# Patient Record
Sex: Female | Born: 1960 | Race: White | Hispanic: No | Marital: Married | Smoking: Never smoker
Health system: Southern US, Community
[De-identification: ages and names within clinical notes are randomized; demographics above are authoritative.]

## PROBLEM LIST (undated history)

## (undated) DIAGNOSIS — C4491 Basal cell carcinoma of skin, unspecified: Secondary | ICD-10-CM

## (undated) DIAGNOSIS — M810 Age-related osteoporosis without current pathological fracture: Secondary | ICD-10-CM

## (undated) HISTORY — PX: MOHS SURGERY: SUR867

## (undated) HISTORY — DX: Age-related osteoporosis without current pathological fracture: M81.0

## (undated) HISTORY — DX: Basal cell carcinoma of skin, unspecified: C44.91

---

## 1997-03-04 ENCOUNTER — Ambulatory Visit (HOSPITAL_COMMUNITY): Admission: RE | Admit: 1997-03-04 | Discharge: 1997-03-04 | Payer: Self-pay | Admitting: Gynecology

## 1999-11-10 ENCOUNTER — Other Ambulatory Visit: Admission: RE | Admit: 1999-11-10 | Discharge: 1999-11-10 | Payer: Self-pay | Admitting: Gynecology

## 1999-11-17 ENCOUNTER — Encounter: Payer: Self-pay | Admitting: Gynecology

## 1999-11-17 ENCOUNTER — Encounter: Admission: RE | Admit: 1999-11-17 | Discharge: 1999-11-17 | Payer: Self-pay | Admitting: Gynecology

## 2000-11-16 ENCOUNTER — Other Ambulatory Visit: Admission: RE | Admit: 2000-11-16 | Discharge: 2000-11-16 | Payer: Self-pay | Admitting: Gynecology

## 2000-11-22 ENCOUNTER — Encounter: Admission: RE | Admit: 2000-11-22 | Discharge: 2000-11-22 | Payer: Self-pay | Admitting: Gynecology

## 2000-11-22 ENCOUNTER — Encounter: Payer: Self-pay | Admitting: Gynecology

## 2001-11-16 ENCOUNTER — Other Ambulatory Visit: Admission: RE | Admit: 2001-11-16 | Discharge: 2001-11-16 | Payer: Self-pay | Admitting: Gynecology

## 2003-01-15 ENCOUNTER — Encounter: Admission: RE | Admit: 2003-01-15 | Discharge: 2003-01-15 | Payer: Self-pay | Admitting: Gynecology

## 2003-11-20 ENCOUNTER — Other Ambulatory Visit: Admission: RE | Admit: 2003-11-20 | Discharge: 2003-11-20 | Payer: Self-pay | Admitting: Gynecology

## 2004-02-06 ENCOUNTER — Encounter: Admission: RE | Admit: 2004-02-06 | Discharge: 2004-02-06 | Payer: Self-pay | Admitting: Gynecology

## 2004-11-23 ENCOUNTER — Other Ambulatory Visit: Admission: RE | Admit: 2004-11-23 | Discharge: 2004-11-23 | Payer: Self-pay | Admitting: Gynecology

## 2005-03-08 ENCOUNTER — Encounter: Admission: RE | Admit: 2005-03-08 | Discharge: 2005-03-08 | Payer: Self-pay | Admitting: Gynecology

## 2006-02-16 ENCOUNTER — Other Ambulatory Visit: Admission: RE | Admit: 2006-02-16 | Discharge: 2006-02-16 | Payer: Self-pay | Admitting: Gynecology

## 2006-03-10 ENCOUNTER — Encounter: Admission: RE | Admit: 2006-03-10 | Discharge: 2006-03-10 | Payer: Self-pay | Admitting: Gynecology

## 2007-04-19 ENCOUNTER — Other Ambulatory Visit: Admission: RE | Admit: 2007-04-19 | Discharge: 2007-04-19 | Payer: Self-pay | Admitting: Gynecology

## 2007-09-06 ENCOUNTER — Encounter: Admission: RE | Admit: 2007-09-06 | Discharge: 2007-09-06 | Payer: Self-pay | Admitting: Gynecology

## 2007-10-19 ENCOUNTER — Ambulatory Visit: Payer: Self-pay | Admitting: Gynecology

## 2008-05-22 ENCOUNTER — Other Ambulatory Visit: Admission: RE | Admit: 2008-05-22 | Discharge: 2008-05-22 | Payer: Self-pay | Admitting: Gynecology

## 2008-05-22 ENCOUNTER — Ambulatory Visit: Payer: Self-pay | Admitting: Gynecology

## 2008-05-22 ENCOUNTER — Encounter: Payer: Self-pay | Admitting: Gynecology

## 2008-09-17 ENCOUNTER — Encounter: Admission: RE | Admit: 2008-09-17 | Discharge: 2008-09-17 | Payer: Self-pay | Admitting: Gynecology

## 2009-08-21 ENCOUNTER — Ambulatory Visit: Payer: Self-pay | Admitting: Gynecology

## 2009-08-21 ENCOUNTER — Other Ambulatory Visit: Admission: RE | Admit: 2009-08-21 | Discharge: 2009-08-21 | Payer: Self-pay | Admitting: Gynecology

## 2009-09-22 ENCOUNTER — Encounter: Admission: RE | Admit: 2009-09-22 | Discharge: 2009-09-22 | Payer: Self-pay | Admitting: Gynecology

## 2010-08-18 ENCOUNTER — Other Ambulatory Visit: Payer: Self-pay | Admitting: Gynecology

## 2010-08-18 DIAGNOSIS — Z1231 Encounter for screening mammogram for malignant neoplasm of breast: Secondary | ICD-10-CM

## 2010-08-26 ENCOUNTER — Other Ambulatory Visit (HOSPITAL_COMMUNITY)
Admission: RE | Admit: 2010-08-26 | Discharge: 2010-08-26 | Disposition: A | Discharge status: Home / Self Care | Payer: Managed Care, Other (non HMO) | Source: Ambulatory Visit | Attending: Gynecology | Admitting: Gynecology

## 2010-08-26 ENCOUNTER — Ambulatory Visit (INDEPENDENT_AMBULATORY_CARE_PROVIDER_SITE_OTHER): Payer: Managed Care, Other (non HMO) | Admitting: Gynecology

## 2010-08-26 ENCOUNTER — Encounter: Payer: Self-pay | Admitting: Gynecology

## 2010-08-26 VITALS — BP 120/80 | Ht 65.25 in | Wt 152.0 lb

## 2010-08-26 DIAGNOSIS — Z01419 Encounter for gynecological examination (general) (routine) without abnormal findings: Secondary | ICD-10-CM

## 2010-08-26 DIAGNOSIS — Z30431 Encounter for routine checking of intrauterine contraceptive device: Secondary | ICD-10-CM

## 2010-08-26 DIAGNOSIS — Z131 Encounter for screening for diabetes mellitus: Secondary | ICD-10-CM

## 2010-08-26 DIAGNOSIS — Z13220 Encounter for screening for lipoid disorders: Secondary | ICD-10-CM

## 2010-08-26 NOTE — Progress Notes (Signed)
Kimberly Schwartz 1960-09-24 [id number]   History:    50 y.o.  for annual exam doing well Mirena IUD no menses. She's not having any menopausal symptoms.  Past medical history,surgical history, family history and social history were all reviewed and documented in the EPIC chart. ROS:  Was performed and pertinent positives and negatives are included in the history.  Exam: chaperone present Filed Vitals:   08/26/10 0948  BP: 120/80   General appearance  Normal Skin grossly normal Head/Neck normal with no cervical or supraclavicular adenopathy thyroid normal Lungs  clear Cardiac RR, without RMG Abdominal  soft, nontender, without masses, guarding, rebound or organomegaly  Breasts  examined lying and sitting without masses, retractions, discharge or axillary adenopathy. Pelvic  Ext/BUS/vagina  normal   Cervix  Normal, IUD string visualized Pap was done   Uterus  anteverted, normal size, shape and contour, midline and mobile nontender   Adnexa  Without masses or tenderness  Anus and perineum  normal   Rectovaginal  normal sphincter tone without palpated masses or tenderness.   Assessment/Plan:  50 y.o. female for annual exam. #1 Health maintenance self breast exams on a monthly basis discussed and urged has mammas scheduled this coming month will followup for this and continue with annual mammography. I recommended screening colonoscopy this coming year she's ever had one she agrees to arrange this increase calcium vitamin D.  Baseline CBC glucose lipid profile and urinalysis were ordered #2 IUD she is amenorrheic with her IUD will plan to keep for the full 5 years she is not having any menopausal symptoms Will plan on checking [facility] next year to see where we stand.     Dara Lords MD, 10:30 AM 08/26/2010

## 2010-08-27 ENCOUNTER — Telehealth: Payer: Self-pay | Admitting: Gynecology

## 2010-08-27 DIAGNOSIS — E789 Disorder of lipoprotein metabolism, unspecified: Secondary | ICD-10-CM

## 2010-08-27 NOTE — Telephone Encounter (Signed)
The patient cholesterol 235 mildly elevated LDL 156 which also is elevated. I recommend she get a fasting lipid profile. Her glucose urinalysis and CBC were okay.  I put an order in for the fasting lipid profile

## 2010-08-27 NOTE — Telephone Encounter (Signed)
PT INFORMED OF LAB RESULTS, SHE WILL CALL BACK TO MAKE APPOINTMENT TO GET FLP RECHECK. PT HAS TO CHECK HER  WORK SCHEDULE.

## 2010-09-29 ENCOUNTER — Ambulatory Visit
Admission: RE | Admit: 2010-09-29 | Discharge: 2010-09-29 | Disposition: A | Discharge status: Home / Self Care | Payer: Managed Care, Other (non HMO) | Source: Ambulatory Visit | Attending: Gynecology | Admitting: Gynecology

## 2010-09-29 DIAGNOSIS — Z1231 Encounter for screening mammogram for malignant neoplasm of breast: Secondary | ICD-10-CM

## 2011-10-06 ENCOUNTER — Other Ambulatory Visit: Payer: Self-pay | Admitting: Gynecology

## 2011-10-06 DIAGNOSIS — Z1231 Encounter for screening mammogram for malignant neoplasm of breast: Secondary | ICD-10-CM

## 2011-10-08 ENCOUNTER — Ambulatory Visit (INDEPENDENT_AMBULATORY_CARE_PROVIDER_SITE_OTHER): Payer: Managed Care, Other (non HMO) | Admitting: Gynecology

## 2011-10-08 ENCOUNTER — Ambulatory Visit
Admission: RE | Admit: 2011-10-08 | Discharge: 2011-10-08 | Disposition: A | Discharge status: Home / Self Care | Payer: Managed Care, Other (non HMO) | Source: Ambulatory Visit | Attending: Gynecology | Admitting: Gynecology

## 2011-10-08 ENCOUNTER — Encounter: Payer: Self-pay | Admitting: Gynecology

## 2011-10-08 VITALS — BP 104/60 | Ht 65.0 in | Wt 156.0 lb

## 2011-10-08 DIAGNOSIS — Z30431 Encounter for routine checking of intrauterine contraceptive device: Secondary | ICD-10-CM

## 2011-10-08 DIAGNOSIS — Z01419 Encounter for gynecological examination (general) (routine) without abnormal findings: Secondary | ICD-10-CM

## 2011-10-08 DIAGNOSIS — Z1231 Encounter for screening mammogram for malignant neoplasm of breast: Secondary | ICD-10-CM

## 2011-10-08 LAB — FOLLICLE STIMULATING HORMONE: FSH: 5.7 m[IU]/mL

## 2011-10-08 NOTE — Patient Instructions (Signed)
Follow up in one year for annual gynecologic exam. Office will call you with hormone test result.

## 2011-10-08 NOTE — Progress Notes (Signed)
Kimberly Schwartz 04/23/1960 [id number]        51 y.o.  G1P1 for annual exam.  Doing well.  Past medical history,surgical history, medications, allergies, family history and social history were all reviewed and documented in the EPIC chart. ROS:  Was performed and pertinent positives and negatives are included in the history.  Exam: [name] assistant Filed Vitals:   10/08/11 1501  BP: 104/60  Height: 5\' 5"  (1.651 m)  Weight: 156 lb (70.761 kg)   General appearance  Normal Skin grossly normal Head/Neck normal with no cervical or supraclavicular adenopathy thyroid normal Lungs  clear Cardiac RR, without RMG Abdominal  soft, nontender, without masses, organomegaly or hernia Breasts  examined lying and sitting without masses, retractions, discharge or axillary adenopathy. Pelvic  Ext/BUS/vagina  normal   Cervix  normal IUD string visualized  Uterus anteverted, normal size, shape and contour, midline and mobile nontender   Adnexa  Without masses or tenderness    Anus and perineum  normal   Rectovaginal  normal sphincter tone without palpated masses or tenderness.    Assessment/Plan:  51 y.o. G1P1 female for annual exam.   1. IUD management. Mirena IUD placed 08/2007. Has occasional spotting. Will check FSH now. Not having overt menopausal symptoms. IUD due to be removed next year. 2. Mammogram. Patient had mammogram today. We'll continue with annual mammography. SBE monthly reviewed. 3. Pap smear. No Pap smear done today. Last Pap smear 2012. No history of abnormal Pap smears before. We'll plan less frequent screening interval. 4. Colonoscopy. Patient will schedule this year. 5. Health maintenance. No blood work done today other than FSH. She plans to have routine blood work done at her primary physician's office and [place] who she sees annually. Follow up one year, sooner as needed    Kimberly Lords MD, 3:31 PM 10/08/2011

## 2011-10-09 LAB — URINALYSIS W MICROSCOPIC + REFLEX CULTURE
Bacteria, UA: NONE SEEN
Bilirubin Urine: NEGATIVE
Casts: NONE SEEN
Crystals: NONE SEEN
Glucose, UA: NEGATIVE mg/dL
Hgb urine dipstick: NEGATIVE
Ketones, ur: NEGATIVE mg/dL
Leukocytes, UA: NEGATIVE
Nitrite: NEGATIVE
Protein, ur: NEGATIVE mg/dL
Specific Gravity, Urine: 1.006 (ref 1.005–1.030)
Squamous Epithelial / LPF: NONE SEEN
Urobilinogen, UA: 0.2 mg/dL (ref 0.0–1.0)
pH: 5.5 (ref 5.0–8.0)

## 2012-10-11 ENCOUNTER — Encounter: Payer: Self-pay | Admitting: Gynecology

## 2012-10-23 LAB — HM COLONOSCOPY

## 2012-11-08 ENCOUNTER — Ambulatory Visit (INDEPENDENT_AMBULATORY_CARE_PROVIDER_SITE_OTHER): Payer: Managed Care, Other (non HMO) | Admitting: Gynecology

## 2012-11-08 ENCOUNTER — Encounter: Payer: Self-pay | Admitting: Gynecology

## 2012-11-08 ENCOUNTER — Telehealth: Payer: Self-pay | Admitting: *Deleted

## 2012-11-08 ENCOUNTER — Other Ambulatory Visit: Payer: Self-pay

## 2012-11-08 ENCOUNTER — Ambulatory Visit
Admission: RE | Admit: 2012-11-08 | Discharge: 2012-11-08 | Disposition: A | Discharge status: Home / Self Care | Payer: Managed Care, Other (non HMO) | Source: Ambulatory Visit

## 2012-11-08 VITALS — BP 120/76 | Ht 65.0 in | Wt 149.0 lb

## 2012-11-08 DIAGNOSIS — Z1231 Encounter for screening mammogram for malignant neoplasm of breast: Secondary | ICD-10-CM

## 2012-11-08 DIAGNOSIS — Z30431 Encounter for routine checking of intrauterine contraceptive device: Secondary | ICD-10-CM

## 2012-11-08 DIAGNOSIS — Z01419 Encounter for gynecological examination (general) (routine) without abnormal findings: Secondary | ICD-10-CM

## 2012-11-08 NOTE — Telephone Encounter (Signed)
Referral faxed to [facility], they will contact pt with time and date.

## 2012-11-08 NOTE — Progress Notes (Signed)
LESIELI BRESEE April 09, 1960 [id number]        52 y.o.  G1P1 for annual exam.  Several issues noted below.  Past medical history,surgical history, medications, allergies, family history and social history were all reviewed and documented in the EPIC chart.  ROS:  Performed and pertinent positives and negatives are included in the history, assessment and plan .  Exam: Kim assistant Filed Vitals:   11/08/12 1146  BP: 120/76  Height: 5\' 5"  (1.651 m)  Weight: 149 lb (67.586 kg)   General appearance  Normal Skin grossly normal Head/Neck normal with no cervical or supraclavicular adenopathy thyroid normal Lungs  clear Cardiac RR, without RMG Abdominal  soft, nontender, without masses, organomegaly or hernia Breasts  examined lying and sitting without masses, retractions, discharge or axillary adenopathy. Pelvic  Ext/BUS/vagina  normal  Cervix  Normal IUD string visualized  Uterus  anteverted, normal size, shape and contour, midline and mobile nontender   Adnexa  Without masses or tenderness    Anus and perineum  normal   Rectovaginal  normal sphincter tone without palpated masses or tenderness.   Procedure: IUD string was visualized, grasped with a [place] forcep and her Mirena IUD was removed, shown to the patient and discarded.   Assessment/Plan:  52 y.o. G1P1 female for annual exam.   1. Mirena IUD. Patient at five-year interval now. Options removed and replaced versus removed and monitor with backup contraception reviewed.  FSH 5.7 last year. Recheck FSH now. Keep menstrual calendar and symptom calendar. If significant irregular bleeding or menopausal symptoms Will followup. Otherwise will use condoms for birth control at this point. 2. Mammography due now and patient is to schedule. Recommended 3-D given family history. Mother had postmenopausal breast cancer at age 2. Sister had inflammatory breast cancer at age 16. Recommend genetic counseling and consider BRCA testing. Issues  of increased surveillance to include MRI reviewed. Chemo preventative options to include Evista also reviewed. Will followup after her genetic counseling as she agrees to arrange. 3. Pap smear 2012. No Pap smear done today. No history of abnormal Pap smears previously. Plan repeat Pap smear next year a 3 year interval. 4. Colonoscopy 2014. Repeat it today are recommended interval. 5. Health maintenance. No lab work done as it is all done through her primary physician's office. Followup one year, sooner as needed.  Note: This document was prepared with digital dictation and possible smart phrase technology. Any transcriptional errors that result from this process are unintentional.   Dara Lords MD, 12:34 PM 11/08/2012

## 2012-11-08 NOTE — Telephone Encounter (Signed)
Message copied by Aura Camps on Wed Nov 08, 2012  3:37 PM ------      Message from: Dara Lords      Created: Wed Nov 08, 2012 12:33 PM       Set up genetic counseling appointment at oncology Center reference mother and sister with breast cancer consider genetic testing ------

## 2012-11-08 NOTE — Patient Instructions (Addendum)
followup for your blood results. Use backup contraception at this point. Keep menstrual calendar and report any atypical bleeding. Report any significant menopausal symptoms

## 2012-11-09 LAB — URINALYSIS W MICROSCOPIC + REFLEX CULTURE
Bacteria, UA: NONE SEEN
Bilirubin Urine: NEGATIVE
Casts: NONE SEEN
Crystals: NONE SEEN
Glucose, UA: NEGATIVE mg/dL
Hgb urine dipstick: NEGATIVE
Ketones, ur: NEGATIVE mg/dL
Leukocytes, UA: NEGATIVE
Nitrite: NEGATIVE
Protein, ur: NEGATIVE mg/dL
Specific Gravity, Urine: 1.007 (ref 1.005–1.030)
Squamous Epithelial / LPF: NONE SEEN
Urobilinogen, UA: 0.2 mg/dL (ref 0.0–1.0)
pH: 7 (ref 5.0–8.0)

## 2012-11-09 LAB — FOLLICLE STIMULATING HORMONE: FSH: 116.6 m[IU]/mL — ABNORMAL HIGH

## 2012-11-13 ENCOUNTER — Telehealth: Payer: Self-pay | Admitting: Genetic Counselor

## 2012-11-13 NOTE — Telephone Encounter (Signed)
PT CALLED AND [facility]PT 1/22 @ 1 W/Marabeth POWELL  WELCOME PACKET MAILED.

## 2012-11-16 NOTE — Telephone Encounter (Signed)
appt 02/16/12 @ 1:00 pm

## 2012-11-30 ENCOUNTER — Other Ambulatory Visit: Payer: Self-pay

## 2013-02-15 ENCOUNTER — Ambulatory Visit (HOSPITAL_BASED_OUTPATIENT_CLINIC_OR_DEPARTMENT_OTHER): Payer: Managed Care, Other (non HMO) | Admitting: Genetic Counselor

## 2013-02-15 ENCOUNTER — Encounter: Payer: Self-pay | Admitting: Genetic Counselor

## 2013-02-15 ENCOUNTER — Other Ambulatory Visit: Payer: Managed Care, Other (non HMO)

## 2013-02-15 DIAGNOSIS — IMO0002 Reserved for concepts with insufficient information to code with codable children: Secondary | ICD-10-CM

## 2013-02-15 DIAGNOSIS — Z803 Family history of malignant neoplasm of breast: Secondary | ICD-10-CM

## 2013-02-15 NOTE — Progress Notes (Signed)
Dr.  Donalynn Furlong requested a consultation for genetic counseling and risk assessment for Kimberly Schwartz, a 53 y.o. female, for discussion of her family history of breast cancer.  She presents to clinic today to discuss the possibility of a genetic predisposition to cancer, and to further clarify her risks, as well as her family members' risks for cancer.   HISTORY OF PRESENT ILLNESS: Kimberly Schwartz is a 53 y.o. female with no personal history of cancer.  She reports never having a breast biopsy, and has had a colonoscopy that was negative.  Past Medical History  Diagnosis Date  . Asthma     Past Surgical History  Procedure Laterality Date  . Cesarean section  1993    History   Social History  . Marital Status: Married    Spouse Name: N/A    Number of Children: 1  . Years of Education: N/A   Occupational History  .     Social History Main Topics  . Smoking status: Never Smoker   . Smokeless tobacco: Never Used  . Alcohol Use: Yes     Comment: socially  . Drug Use: No  . Sexual Activity: Yes    Birth Control/ Protection: Condom   Other Topics Concern  . None   Social History Narrative  . None    REPRODUCTIVE HISTORY AND PERSONAL RISK ASSESSMENT FACTORS: Menarche was at age 10.   postmenopausal Uterus Intact: yes Ovaries Intact: yes G1P1A0, first live birth at age 1  She has not previously undergone treatment for infertility.   Oral Contraceptive use: 10 years   She has not used HRT in the past.    FAMILY HISTORY:  We obtained a detailed, 4-generation family history.  Significant diagnoses are listed below: Family History  Problem Relation Age of Onset  . Breast cancer Mother 72    after menopause  . Breast cancer Sister 47    inflammatory breast cancer; pre-menopausal  . Prostate cancer Brother 7  . Heart attack Maternal Uncle 40  . Breast cancer Cousin 58  . Hodgkin's lymphoma Cousin     dx in her teens and went on to develop breast  cancer  . Bladder Cancer Cousin     non-smoker  The patient's mother was diagnosed with breast cancer, and her sister had inflammatory breast cancer.  Her father had five sisters and one brother, none of whom had cancer.  One sister had two daughters with cancer, one had hodgkin's lymphoma in her teens and then went on to develop breast cancer at 56.  The other had bladder cancer.  The patient's mother had two brothers, one who died at 2 from a heart attack.  Patient's maternal ancestors are of [place] descent, and paternal ancestors are of [place] descent. There is no reported Ashkenazi Jewish ancestry. There is no known consanguinity.  GENETIC COUNSELING ASSESSMENT: Kimberly Schwartz is a 53 y.o. female with a family history of breast cancer which somewhat suggestive of a hereditary cancer syndrome vs sporadic or familial cancer syndrome and predisposition to cancer. We, therefore, discussed and recommended the following at today's visit.   DISCUSSION: We reviewed the characteristics, features and inheritance patterns of hereditary cancer syndromes. We also discussed genetic testing, including the appropriate family members to test, the process of testing, insurance coverage and turn-around-time for results. We discussed that inflammatory breast cancer is not typically apart of a hereditary cancer syndrome.  Most breast cancers that are involved with hereditary cancer syndromes  are ductal, and a few are lobular.  Therefore, she has a lower chance of the breast cancer in the family being a part of a cancer syndrome such as a BRCA mutation.  In order to estimate her chance of having a BRCA mutation, we used statistical models (Penn II, Tyrer Cusik and Myriad risk calculator) and laboratory data that take into account her personal medical history, family history and ancestry.  Because each model is different, there can be a lot of variability in the risks they give.  Therefore, these numbers must be  considered a rough range and not a precise risk of having a BRCA mutation.  These models estimate that she has approximately a 0.5-5% chance of having a mutation. This does include the sister with inflammatory breast cancer, which is typically not associated with BRCA mutations.  Based on this assessment of her family and personal history, genetic testing is not recommended.  Based on the patient's personal and family history, statistical models (Tyrer Cusik and [place])  and literature data were used to estimate her risk of developing breast cancer. These estimate her lifetime risk of developing breast cancer to be approximately 21.3% to 39.3%. This estimation does not take into account any genetic testing results, and it does take into account her sister with inflammatory breast cancer.  The patient's lifetime breast cancer risk is a preliminary estimate based on available information using one of several models endorsed by the [place] (ACS). The ACS recommends consideration of breast MRI screening as an adjunct to mammography for patients at high risk (defined as 20% or greater lifetime risk). A more detailed breast cancer risk assessment can be considered, if clinically indicated.   PLAN: After considering the risks, benefits, and limitations, Kimberly Schwartz declined testing at this time.  We discussed that if she changes her mind, we could order testing at a future date. We discussed the implications of a positive, negative and/ or variant of uncertain significance genetic test result. Results should be available within approximately 3 weeks' time, at which point they will be disclosed by telephone to Kimberly Schwartz, as will any additional recommendations warranted by these results. Kimberly Schwartz will receive a summary of her genetic counseling visit and a copy of her results once available. This information will also be available in Epic. We encouraged Kimberly Schwartz to  remain in contact with cancer genetics annually so that we can continuously update the family history and inform her of any changes in cancer genetics and testing that may be of benefit for her family. Kimberly Schwartz's questions were answered to her satisfaction today. Our contact information was provided should additional questions or concerns arise.  The patient was seen for a total of 45 minutes, greater than 50% of which was spent face-to-face counseling.  This note will also be sent to the referring provider via the electronic medical record. The patient will be supplied with a summary of this genetic counseling discussion as well as educational information on the discussed hereditary cancer syndromes following the conclusion of their visit.   Patient was discussed with Dr. Marcy Panning.   _______________________________________________________________________ For Office Staff:  Number of people involved in session: 1 Was an Intern/ student involved with case: no

## 2013-09-24 ENCOUNTER — Other Ambulatory Visit: Payer: Self-pay

## 2013-09-24 DIAGNOSIS — Z1231 Encounter for screening mammogram for malignant neoplasm of breast: Secondary | ICD-10-CM

## 2013-11-09 ENCOUNTER — Ambulatory Visit
Admission: RE | Admit: 2013-11-09 | Discharge: 2013-11-09 | Disposition: A | Discharge status: Home / Self Care | Payer: Managed Care, Other (non HMO) | Source: Ambulatory Visit

## 2013-11-09 DIAGNOSIS — Z1231 Encounter for screening mammogram for malignant neoplasm of breast: Secondary | ICD-10-CM

## 2013-11-14 ENCOUNTER — Encounter: Payer: Managed Care, Other (non HMO) | Admitting: Gynecology

## 2013-11-14 ENCOUNTER — Other Ambulatory Visit: Payer: Self-pay | Admitting: Gynecology

## 2013-11-14 DIAGNOSIS — R928 Other abnormal and inconclusive findings on diagnostic imaging of breast: Secondary | ICD-10-CM

## 2013-11-23 ENCOUNTER — Other Ambulatory Visit (HOSPITAL_COMMUNITY)
Admission: RE | Admit: 2013-11-23 | Discharge: 2013-11-23 | Disposition: A | Discharge status: Home / Self Care | Payer: Managed Care, Other (non HMO) | Source: Ambulatory Visit | Attending: Gynecology | Admitting: Gynecology

## 2013-11-23 ENCOUNTER — Ambulatory Visit (INDEPENDENT_AMBULATORY_CARE_PROVIDER_SITE_OTHER): Payer: Managed Care, Other (non HMO) | Admitting: Gynecology

## 2013-11-23 ENCOUNTER — Encounter: Payer: Self-pay | Admitting: Gynecology

## 2013-11-23 VITALS — BP 120/76 | Ht 65.0 in | Wt 148.0 lb

## 2013-11-23 DIAGNOSIS — Z01419 Encounter for gynecological examination (general) (routine) without abnormal findings: Secondary | ICD-10-CM | POA: Insufficient documentation

## 2013-11-23 DIAGNOSIS — Z1151 Encounter for screening for human papillomavirus (HPV): Secondary | ICD-10-CM | POA: Diagnosis present

## 2013-11-23 NOTE — Patient Instructions (Signed)
Follow up with me after your repeat breast studies. We will arrange for an MRI this coming year.  You may obtain a copy of any labs that were done today by logging onto MyChart as outlined in the instructions provided with your AVS (after visit summary). The office will not call with normal lab results but certainly if there are any significant abnormalities then we will contact you.   Health Maintenance, Female A healthy lifestyle and preventative care can promote health and wellness.  Maintain regular health, dental, and eye exams.  Eat a healthy diet. Foods like vegetables, fruits, whole grains, low-fat dairy products, and lean protein foods contain the nutrients you need without too many calories. Decrease your intake of foods high in solid fats, added sugars, and salt. Get information about a proper diet from your caregiver, if necessary.  Regular physical exercise is one of the most important things you can do for your health. Most adults should get at least 150 minutes of moderate-intensity exercise (any activity that increases your heart rate and causes you to sweat) each week. In addition, most adults need muscle-strengthening exercises on 2 or more days a week.   Maintain a healthy weight. The body mass index (BMI) is a screening tool to identify possible weight problems. It provides an estimate of body fat based on height and weight. Your caregiver can help determine your BMI, and can help you achieve or maintain a healthy weight. For adults 20 years and older:  A BMI below 18.5 is considered underweight.  A BMI of 18.5 to 24.9 is normal.  A BMI of 25 to 29.9 is considered overweight.  A BMI of 30 and above is considered obese.  Maintain normal blood lipids and cholesterol by exercising and minimizing your intake of saturated fat. Eat a balanced diet with plenty of fruits and vegetables. Blood tests for lipids and cholesterol should begin at age 8 and be repeated every 5 years. If  your lipid or cholesterol levels are high, you are over 50, or you are a high risk for heart disease, you may need your cholesterol levels checked more frequently.Ongoing high lipid and cholesterol levels should be treated with medicines if diet and exercise are not effective.  If you smoke, find out from your caregiver how to quit. If you do not use tobacco, do not start.  Lung cancer screening is recommended for adults aged 55 80 years who are at high risk for developing lung cancer because of a history of smoking. Yearly low-dose computed tomography (CT) is recommended for people who have at least a 30-pack-year history of smoking and are a current smoker or have quit within the past 15 years. A pack year of smoking is smoking an average of 1 pack of cigarettes a day for 1 year (for example: 1 pack a day for 30 years or 2 packs a day for 15 years). Yearly screening should continue until the smoker has stopped smoking for at least 15 years. Yearly screening should also be stopped for people who develop a health problem that would prevent them from having lung cancer treatment.  If you are pregnant, do not drink alcohol. If you are breastfeeding, be very cautious about drinking alcohol. If you are not pregnant and choose to drink alcohol, do not exceed 1 drink per day. One drink is considered to be 12 ounces (355 mL) of beer, 5 ounces (148 mL) of wine, or 1.5 ounces (44 mL) of liquor.  Avoid use of  street drugs. Do not share needles with anyone. Ask for help if you need support or instructions about stopping the use of drugs.  High blood pressure causes heart disease and increases the risk of stroke. Blood pressure should be checked at least every 1 to 2 years. Ongoing high blood pressure should be treated with medicines, if weight loss and exercise are not effective.  If you are 73 to 54 years old, ask your caregiver if you should take aspirin to prevent strokes.  Diabetes screening involves taking  a blood sample to check your fasting blood sugar level. This should be done once every 3 years, after age 35, if you are within normal weight and without risk factors for diabetes. Testing should be considered at a younger age or be carried out more frequently if you are overweight and have at least 1 risk factor for diabetes.  Breast cancer screening is essential preventative care for women. You should practice "breast self-awareness." This means understanding the normal appearance and feel of your breasts and may include breast self-examination. Any changes detected, no matter how small, should be reported to a caregiver. Women in their 44s and 30s should have a clinical breast exam (CBE) by a caregiver as part of a regular health exam every 1 to 3 years. After age 19, women should have a CBE every year. Starting at age 57, women should consider having a mammogram (breast X-ray) every year. Women who have a family history of breast cancer should talk to their caregiver about genetic screening. Women at a high risk of breast cancer should talk to their caregiver about having an MRI and a mammogram every year.  Breast cancer gene (BRCA)-related cancer risk assessment is recommended for women who have family members with BRCA-related cancers. BRCA-related cancers include breast, ovarian, tubal, and peritoneal cancers. Having family members with these cancers may be associated with an increased risk for harmful changes (mutations) in the breast cancer genes BRCA1 and BRCA2. Results of the assessment will determine the need for genetic counseling and BRCA1 and BRCA2 testing.  The Pap test is a screening test for cervical cancer. Women should have a Pap test starting at age 55. Between ages 69 and 1, Pap tests should be repeated every 2 years. Beginning at age 10, you should have a Pap test every 3 years as long as the past 3 Pap tests have been normal. If you had a hysterectomy for a problem that was not cancer  or a condition that could lead to cancer, then you no longer need Pap tests. If you are between ages 68 and 9, and you have had normal Pap tests going back 10 years, you no longer need Pap tests. If you have had past treatment for cervical cancer or a condition that could lead to cancer, you need Pap tests and screening for cancer for at least 20 years after your treatment. If Pap tests have been discontinued, risk factors (such as a new sexual partner) need to be reassessed to determine if screening should be resumed. Some women have medical problems that increase the chance of getting cervical cancer. In these cases, your caregiver may recommend more frequent screening and Pap tests.  The human papillomavirus (HPV) test is an additional test that may be used for cervical cancer screening. The HPV test looks for the virus that can cause the cell changes on the cervix. The cells collected during the Pap test can be tested for HPV. The HPV test could  be used to screen women aged 62 years and older, and should be used in women of any age who have unclear Pap test results. After the age of 47, women should have HPV testing at the same frequency as a Pap test.  Colorectal cancer can be detected and often prevented. Most routine colorectal cancer screening begins at the age of 80 and continues through age 58. However, your caregiver may recommend screening at an earlier age if you have risk factors for colon cancer. On a yearly basis, your caregiver may provide home test kits to check for hidden blood in the stool. Use of a small camera at the end of a tube, to directly examine the colon (sigmoidoscopy or colonoscopy), can detect the earliest forms of colorectal cancer. Talk to your caregiver about this at age 6, when routine screening begins. Direct examination of the colon should be repeated every 5 to 10 years through age 91, unless early forms of pre-cancerous polyps or small growths are found.  Hepatitis C  blood testing is recommended for all people born from [birth year] through 1966 and any individual with known risks for hepatitis C.  Practice safe sex. Use condoms and avoid high-risk sexual practices to reduce the spread of sexually transmitted infections (STIs). Sexually active women aged 67 and younger should be checked for Chlamydia, which is a common sexually transmitted infection. Older women with new or multiple partners should also be tested for Chlamydia. Testing for other STIs is recommended if you are sexually active and at increased risk.  Osteoporosis is a disease in which the bones lose minerals and strength with aging. This can result in serious bone fractures. The risk of osteoporosis can be identified using a bone density scan. Women ages 76 and over and women at risk for fractures or osteoporosis should discuss screening with their caregivers. Ask your caregiver whether you should be taking a calcium supplement or vitamin D to reduce the rate of osteoporosis.  Menopause can be associated with physical symptoms and risks. Hormone replacement therapy is available to decrease symptoms and risks. You should talk to your caregiver about whether hormone replacement therapy is right for you.  Use sunscreen. Apply sunscreen liberally and repeatedly throughout the day. You should seek shade when your shadow is shorter than you. Protect yourself by wearing long sleeves, pants, a wide-brimmed hat, and sunglasses year round, whenever you are outdoors.  Notify your caregiver of new moles or changes in moles, especially if there is a change in shape or color. Also notify your caregiver if a mole is larger than the size of a pencil eraser.  Stay current with your immunizations. Document Released: 07/27/2010 Document Revised: 05/08/2012 Document Reviewed: 07/27/2010 [facility] Patient Information 2014 [employer].

## 2013-11-23 NOTE — Progress Notes (Signed)
Kimberly MorrowKaren J Schwartz 04/11/1960 [id number]        53 y.o.  G1P1 for annual exam.  Several issues noted below.  Past medical history,surgical history, problem list, medications, allergies, family history and social history were all reviewed and documented as reviewed in the EPIC chart.  ROS:  12 system ROS performed with pertinent positives and negatives included in the history, assessment and plan.   Additional significant findings :  none   Exam: [facility] personassistant Filed Vitals:   11/23/13 1602  BP: 120/76  Height: 5\' 5"  (1.651 m)  Weight: 148 lb (67.132 kg)   General appearance:  Normal affect, orientation and appearance. Skin: Grossly normal HEENT: Without gross lesions.  No cervical or supraclavicular adenopathy. Thyroid normal.  Lungs:  Clear without wheezing, rales or rhonchi Cardiac: RR, without RMG Abdominal:  Soft, nontender, without masses, guarding, rebound, organomegaly or hernia Breasts:  Examined lying and sitting without masses, retractions, discharge or axillary adenopathy. Pelvic:  Ext/BUS/vagina normal  Cervix normal. Pap/HPV  Uterus anteverted, normal size, shape and contour, midline and mobile nontender   Adnexa  Without masses or tenderness    Anus and perineum  Normal   Rectovaginal  Normal sphincter tone without palpated masses or tenderness.    Assessment/Plan:  [age] y.o. G1P1 female for annual exam.   1. Postmenopausal.  Without significant hot flushes, night sweats, vaginal dryness or dyspareunia. No vaginal bleeding. Continue to monitor. Report any vaginal bleeding.  Had Mirena IUD removed last year.  FSH was 116. 2. Pap smear 2012. Pap/HPV today. No history of abnormal Pap smears previously. Plan repeat at 3-5 year interval per current screening guidelines assuming this Pap smear normal. 3. Mammography 10/2013. Has recall scheduled for next week due to calcifications. Strong family history of breast cancer  In both mother and sister.  [facility] [place]. Did not recommend genetic testing. Did calculate her lifetime risk of developing breast cancer at 21% to 39% depending on the model used to calculate. I reviewed with the patient the ACS recommendations for screening MRI for those patients exceeding 20% risk. We both agree to proceed with MRI screening but will going to wait until her follow up films in the event that they do a biopsy in this would alter the plan.  Patient knows to call me regardless in 2 weeks in follow up of her breast evaluation and then we can arrange for the MRI. 4. Colonoscopy 2014. Repeated their recommended interval. 5. DEXA. Will plan further into the menopause. Increased calcium and vitamin D recommendations. 6. Health maintenance. No routine blood work done as she reports is done at her primary physician's office. Follow up after her mammogram additional views to arrange for MRI. Follow up in one year, sooner as needed.     Dara LordsFONTAINE,Murice Barbar P MD, 5:18 PM 11/23/2013

## 2013-11-24 LAB — URINALYSIS W MICROSCOPIC + REFLEX CULTURE
Bacteria, UA: NONE SEEN
Bilirubin Urine: NEGATIVE
Casts: NONE SEEN
Crystals: NONE SEEN
Glucose, UA: NEGATIVE mg/dL
Hgb urine dipstick: NEGATIVE
Ketones, ur: NEGATIVE mg/dL
Leukocytes, UA: NEGATIVE
Nitrite: NEGATIVE
Protein, ur: NEGATIVE mg/dL
Specific Gravity, Urine: 1.017 (ref 1.005–1.030)
Squamous Epithelial / LPF: NONE SEEN
Urobilinogen, UA: 0.2 mg/dL (ref 0.0–1.0)
pH: 5.5 (ref 5.0–8.0)

## 2013-11-26 ENCOUNTER — Encounter: Payer: Self-pay | Admitting: Gynecology

## 2013-11-27 ENCOUNTER — Ambulatory Visit
Admission: RE | Admit: 2013-11-27 | Discharge: 2013-11-27 | Disposition: A | Discharge status: Home / Self Care | Payer: Managed Care, Other (non HMO) | Source: Ambulatory Visit | Attending: Gynecology | Admitting: Gynecology

## 2013-11-27 DIAGNOSIS — R928 Other abnormal and inconclusive findings on diagnostic imaging of breast: Secondary | ICD-10-CM

## 2013-11-27 LAB — CYTOLOGY - PAP

## 2014-01-08 ENCOUNTER — Telehealth: Payer: Self-pay | Admitting: *Deleted

## 2014-01-08 DIAGNOSIS — Z803 Family history of malignant neoplasm of breast: Secondary | ICD-10-CM

## 2014-01-08 NOTE — Telephone Encounter (Signed)
(  Follow up from OV 11/23/13) Pt called back to have MRI of breast scheduled, do you want with and with out contrast? Please advise

## 2014-01-08 NOTE — Telephone Encounter (Signed)
Whatever their protocol is for high risk breast cancer screening

## 2014-01-09 NOTE — Telephone Encounter (Signed)
Order placed at [facility], I left number on pt voicemail to call to schedule. They have several questions to asked pt regarding her history.

## 2014-01-10 NOTE — Telephone Encounter (Signed)
Appointment on 01/22/14 @ 6:00pm

## 2014-01-22 ENCOUNTER — Other Ambulatory Visit: Payer: Managed Care, Other (non HMO)

## 2014-05-15 ENCOUNTER — Other Ambulatory Visit: Payer: Self-pay | Admitting: Gynecology

## 2014-05-15 DIAGNOSIS — R921 Mammographic calcification found on diagnostic imaging of breast: Secondary | ICD-10-CM

## 2014-06-07 ENCOUNTER — Ambulatory Visit
Admission: RE | Admit: 2014-06-07 | Discharge: 2014-06-07 | Disposition: A | Discharge status: Home / Self Care | Payer: BLUE CROSS/BLUE SHIELD | Source: Ambulatory Visit | Attending: Gynecology | Admitting: Gynecology

## 2014-06-07 DIAGNOSIS — R921 Mammographic calcification found on diagnostic imaging of breast: Secondary | ICD-10-CM

## 2014-11-12 ENCOUNTER — Other Ambulatory Visit: Payer: Self-pay

## 2014-11-12 DIAGNOSIS — Z1231 Encounter for screening mammogram for malignant neoplasm of breast: Secondary | ICD-10-CM

## 2014-12-03 ENCOUNTER — Ambulatory Visit
Admission: RE | Admit: 2014-12-03 | Discharge: 2014-12-03 | Disposition: A | Discharge status: Home / Self Care | Payer: BLUE CROSS/BLUE SHIELD | Source: Ambulatory Visit

## 2014-12-03 DIAGNOSIS — Z1231 Encounter for screening mammogram for malignant neoplasm of breast: Secondary | ICD-10-CM

## 2015-01-29 ENCOUNTER — Encounter: Payer: Self-pay | Admitting: Gynecology

## 2015-01-29 ENCOUNTER — Ambulatory Visit (INDEPENDENT_AMBULATORY_CARE_PROVIDER_SITE_OTHER): Payer: BLUE CROSS/BLUE SHIELD | Admitting: Gynecology

## 2015-01-29 VITALS — BP 116/74 | Ht 65.0 in | Wt 148.0 lb

## 2015-01-29 DIAGNOSIS — Z01419 Encounter for gynecological examination (general) (routine) without abnormal findings: Secondary | ICD-10-CM | POA: Diagnosis not present

## 2015-01-29 DIAGNOSIS — N952 Postmenopausal atrophic vaginitis: Secondary | ICD-10-CM

## 2015-01-29 DIAGNOSIS — Z803 Family history of malignant neoplasm of breast: Secondary | ICD-10-CM

## 2015-01-29 NOTE — Progress Notes (Signed)
Gaynelle AduKaren Carducci 07/27/1960 [id number]        55 y.o.  G1P1  for annual exam.   Doing well without complaints.  Past medical history,surgical history, problem list, medications, allergies, family history and social history were all reviewed and documented as reviewed in the EPIC chart.  ROS:  Performed with pertinent positives and negatives included in the history, assessment and plan.   Additional significant findings :  None   Exam: Kennon PortelaKim Gardner assistant Filed Vitals:   01/29/15 1534  BP: 116/74  Height: 5\' 5"  (1.651 m)  Weight: 148 lb (67.132 kg)   General appearance:  Normal affect, orientation and appearance. Skin: Grossly normal HEENT: Without gross lesions.  No cervical or supraclavicular adenopathy. Thyroid normal.  Lungs:  Clear without wheezing, rales or rhonchi Cardiac: RR, without RMG Abdominal:  Soft, nontender, without masses, guarding, rebound, organomegaly or hernia Breasts:  Examined lying and sitting without masses, retractions, discharge or axillary adenopathy. Pelvic:  Ext/BUS/vagina normal with atrophic changes  Cervix normal with atrophic changes  Uterus anteverted, normal size, shape and contour, midline and mobile nontender   Adnexa  Without masses or tenderness    Anus and perineum  Normal   Rectovaginal  Normal sphincter tone without palpated masses or tenderness.    Assessment/Plan:  55 y.o. G1P1 female for annual exam.   1. Postmenopausal/atrophic genital changes. Without significant hot flashes, night sweats, vaginal dryness or any vaginal bleeding. Continue to monitor and report any issues or vaginal bleeding. 2. Mammography 11/2014. Strong family history of breast cancer. Saw genetic counselor the patient declined testing at that time. They did calculate lifetime risk of breast cancer between 21% and 39%. Discussed adding annual MRI per ACS recommendations last year.  Patient initially agreed but when we went to arrange this apparently it was too  expensive and she declined. She now is interested in pursuing this will help her schedule this. She'll continue with annual mammography. SBE monthly reviewed. 3. Colonoscopy 2014. Repeat at their recommended interval. 4. Pap smear/HPV 2015 negative. No Pap smear done today. No history of abnormal Pap smears previously. Plan repeat at 3-5 year interval per current screening guidelines. 5. DEXA never. Will plan further into the menopause. Increase calcium and vitamin D. 6. Health maintenance. No routine blood work done as this is done at her primary physician's office. Follow up 1 year, sooner as needed.   Dara LordsFONTAINE,Darrion Macaulay P MD, 3:57 PM 01/29/2015

## 2015-01-29 NOTE — Patient Instructions (Signed)
Office will call you to help arrange the MRI of the breast. Call if you do not hear within the next 2 weeks.  You may obtain a copy of any labs that were done today by logging onto MyChart as outlined in the instructions provided with your AVS (after visit summary). The office will not call with normal lab results but certainly if there are any significant abnormalities then we will contact you.   Health Maintenance Adopting a healthy lifestyle and getting preventive care can go a long way to promote health and wellness. Talk with your health care provider about what schedule of regular examinations is right for you. This is a good chance for you to check in with your provider about disease prevention and staying healthy. In between checkups, there are plenty of things you can do on your own. Experts have done a lot of research about which lifestyle changes and preventive measures are most likely to keep you healthy. Ask your health care provider for more information. WEIGHT AND DIET  Eat a healthy diet  Be sure to include plenty of vegetables, fruits, low-fat dairy products, and lean protein.  Do not eat a lot of foods high in solid fats, added sugars, or salt.  Get regular exercise. This is one of the most important things you can do for your health.  Most adults should exercise for at least 150 minutes each week. The exercise should increase your heart rate and make you sweat (moderate-intensity exercise).  Most adults should also do strengthening exercises at least twice a week. This is in addition to the moderate-intensity exercise.  Maintain a healthy weight  Body mass index (BMI) is a measurement that can be used to identify possible weight problems. It estimates body fat based on height and weight. Your health care provider can help determine your BMI and help you achieve or maintain a healthy weight.  For females 59 years of age and older:   A BMI below 18.5 is considered  underweight.  A BMI of 18.5 to 24.9 is normal.  A BMI of 25 to 29.9 is considered overweight.  A BMI of 30 and above is considered obese.  Watch levels of cholesterol and blood lipids  You should start having your blood tested for lipids and cholesterol at 56 years of age, then have this test every 5 years.  You may need to have your cholesterol levels checked more often if:  Your lipid or cholesterol levels are high.  You are older than 56 years of age.  You are at high risk for heart disease.  CANCER SCREENING   Lung Cancer  Lung cancer screening is recommended for adults 15-37 years old who are at high risk for lung cancer because of a history of smoking.  A yearly low-dose CT scan of the lungs is recommended for people who:  Currently smoke.  Have quit within the past 15 years.  Have at least a 30-pack-year history of smoking. A pack year is smoking an average of one pack of cigarettes a day for 1 year.  Yearly screening should continue until it has been 15 years since you quit.  Yearly screening should stop if you develop a health problem that would prevent you from having lung cancer treatment.  Breast Cancer  Practice breast self-awareness. This means understanding how your breasts normally appear and feel.  It also means doing regular breast self-exams. Let your health care provider know about any changes, no matter how small.  If you are in your 20s or 30s, you should have a clinical breast exam (CBE) by a health care provider every 1-3 years as part of a regular health exam.  If you are 42 or older, have a CBE every year. Also consider having a breast X-ray (mammogram) every year.  If you have a family history of breast cancer, talk to your health care provider about genetic screening.  If you are at high risk for breast cancer, talk to your health care provider about having an MRI and a mammogram every year.  Breast cancer gene (BRCA) assessment is  recommended for women who have family members with BRCA-related cancers. BRCA-related cancers include:  Breast.  Ovarian.  Tubal.  Peritoneal cancers.  Results of the assessment will determine the need for genetic counseling and BRCA1 and BRCA2 testing. Cervical Cancer Routine pelvic examinations to screen for cervical cancer are no longer recommended for nonpregnant women who are considered low risk for cancer of the pelvic organs (ovaries, uterus, and vagina) and who do not have symptoms. A pelvic examination may be necessary if you have symptoms including those associated with pelvic infections. Ask your health care provider if a screening pelvic exam is right for you.   The Pap test is the screening test for cervical cancer for women who are considered at risk.  If you had a hysterectomy for a problem that was not cancer or a condition that could lead to cancer, then you no longer need Pap tests.  If you are older than 65 years, and you have had normal Pap tests for the past 10 years, you no longer need to have Pap tests.  If you have had past treatment for cervical cancer or a condition that could lead to cancer, you need Pap tests and screening for cancer for at least 20 years after your treatment.  If you no longer get a Pap test, assess your risk factors if they change (such as having a new sexual partner). This can affect whether you should start being screened again.  Some women have medical problems that increase their chance of getting cervical cancer. If this is the case for you, your health care provider may recommend more frequent screening and Pap tests.  The human papillomavirus (HPV) test is another test that may be used for cervical cancer screening. The HPV test looks for the virus that can cause cell changes in the cervix. The cells collected during the Pap test can be tested for HPV.  The HPV test can be used to screen women 52 years of age and older. Getting tested  for HPV can extend the interval between normal Pap tests from three to five years.  An HPV test also should be used to screen women of any age who have unclear Pap test results.  After 56 years of age, women should have HPV testing as often as Pap tests.  Colorectal Cancer  This type of cancer can be detected and often prevented.  Routine colorectal cancer screening usually begins at 56 years of age and continues through 56 years of age.  Your health care provider may recommend screening at an earlier age if you have risk factors for colon cancer.  Your health care provider may also recommend using home test kits to check for hidden blood in the stool.  A small camera at the end of a tube can be used to examine your colon directly (sigmoidoscopy or colonoscopy). This is done to check for  the earliest forms of colorectal cancer.  Routine screening usually begins at age 68.  Direct examination of the colon should be repeated every 5-10 years through 56 years of age. However, you may need to be screened more often if early forms of precancerous polyps or small growths are found. Skin Cancer  Check your skin from head to toe regularly.  Tell your health care provider about any new moles or changes in moles, especially if there is a change in a mole's shape or color.  Also tell your health care provider if you have a mole that is larger than the size of a pencil eraser.  Always use sunscreen. Apply sunscreen liberally and repeatedly throughout the day.  Protect yourself by wearing long sleeves, pants, a wide-brimmed hat, and sunglasses whenever you are outside. HEART DISEASE, DIABETES, AND HIGH BLOOD PRESSURE   Have your blood pressure checked at least every 1-2 years. High blood pressure causes heart disease and increases the risk of stroke.  If you are between 52 years and 26 years old, ask your health care provider if you should take aspirin to prevent strokes.  Have regular  diabetes screenings. This involves taking a blood sample to check your fasting blood sugar level.  If you are at a normal weight and have a low risk for diabetes, have this test once every three years after 56 years of age.  If you are overweight and have a high risk for diabetes, consider being tested at a younger age or more often. PREVENTING INFECTION  Hepatitis B  If you have a higher risk for hepatitis B, you should be screened for this virus. You are considered at high risk for hepatitis B if:  You were born in a country where hepatitis B is common. Ask your health care provider which countries are considered high risk.  Your parents were born in a high-risk country, and you have not been immunized against hepatitis B (hepatitis B vaccine).  You have HIV or AIDS.  You use needles to inject street drugs.  You live with someone who has hepatitis B.  You have had sex with someone who has hepatitis B.  You get hemodialysis treatment.  You take certain medicines for conditions, including cancer, organ transplantation, and autoimmune conditions. Hepatitis C  Blood testing is recommended for:  Everyone born from [birth year] through 1966.  Anyone with known risk factors for hepatitis C. Sexually transmitted infections (STIs)  You should be screened for sexually transmitted infections (STIs) including gonorrhea and chlamydia if:  You are sexually active and are younger than 56 years of age.  You are older than 56 years of age and your health care provider tells you that you are at risk for this type of infection.  Your sexual activity has changed since you were last screened and you are at an increased risk for chlamydia or gonorrhea. Ask your health care provider if you are at risk.  If you do not have HIV, but are at risk, it may be recommended that you take a prescription medicine daily to prevent HIV infection. This is called pre-exposure prophylaxis (PrEP). You are considered at  risk if:  You are sexually active and do not regularly use condoms or know the HIV status of your partner(s).  You take drugs by injection.  You are sexually active with a partner who has HIV. Talk with your health care provider about whether you are at high risk of being infected with HIV. If you  choose to begin PrEP, you should first be tested for HIV. You should then be tested every 3 months for as long as you are taking PrEP.  PREGNANCY   If you are premenopausal and you may become pregnant, ask your health care provider about preconception counseling.  If you may become pregnant, take 400 to 800 micrograms (mcg) of folic acid every day.  If you want to prevent pregnancy, talk to your health care provider about birth control (contraception). OSTEOPOROSIS AND MENOPAUSE   Osteoporosis is a disease in which the bones lose minerals and strength with aging. This can result in serious bone fractures. Your risk for osteoporosis can be identified using a bone density scan.  If you are 9 years of age or older, or if you are at risk for osteoporosis and fractures, ask your health care provider if you should be screened.  Ask your health care provider whether you should take a calcium or vitamin D supplement to lower your risk for osteoporosis.  Menopause may have certain physical symptoms and risks.  Hormone replacement therapy may reduce some of these symptoms and risks. Talk to your health care provider about whether hormone replacement therapy is right for you.  HOME CARE INSTRUCTIONS   Schedule regular health, dental, and eye exams.  Stay current with your immunizations.   Do not use any tobacco products including cigarettes, chewing tobacco, or electronic cigarettes.  If you are pregnant, do not drink alcohol.  If you are breastfeeding, limit how much and how often you drink alcohol.  Limit alcohol intake to no more than 1 drink per day for nonpregnant women. One drink equals  12 ounces of beer, 5 ounces of wine, or 1 ounces of hard liquor.  Do not use street drugs.  Do not share needles.  Ask your health care provider for help if you need support or information about quitting drugs.  Tell your health care provider if you often feel depressed.  Tell your health care provider if you have ever been abused or do not feel safe at home. Document Released: 07/27/2010 Document Revised: 05/28/2013 Document Reviewed: 12/13/2012 [facility] Patient Information 2015 [place], [place]. This information is not intended to replace advice given to you by your health care provider. Make sure you discuss any questions you have with your health care provider.

## 2015-01-30 ENCOUNTER — Telehealth: Payer: Self-pay | Admitting: *Deleted

## 2015-01-30 DIAGNOSIS — Z803 Family history of malignant neoplasm of breast: Secondary | ICD-10-CM

## 2015-01-30 NOTE — Telephone Encounter (Signed)
-----   Message from Dara Lordsimothy P Fontaine, MD sent at 01/29/2015  4:03 PM EST ----- Arrange MRI screening of the breast reference calculated lifetime risk of breast cancer 21 to 39% depending on which model used

## 2015-01-30 NOTE — Telephone Encounter (Signed)
Order placed, gave pt # to call [facility] to schedule, as they prefer to speak with patient.

## 2015-05-30 ENCOUNTER — Ambulatory Visit
Admission: RE | Admit: 2015-05-30 | Discharge: 2015-05-30 | Disposition: A | Discharge status: Home / Self Care | Payer: BLUE CROSS/BLUE SHIELD | Source: Ambulatory Visit | Attending: Gynecology | Admitting: Gynecology

## 2015-05-30 DIAGNOSIS — Z803 Family history of malignant neoplasm of breast: Secondary | ICD-10-CM

## 2015-05-30 DIAGNOSIS — N6489 Other specified disorders of breast: Secondary | ICD-10-CM | POA: Diagnosis not present

## 2015-05-30 MED ORDER — GADOBENATE DIMEGLUMINE 529 MG/ML IV SOLN
13.0000 mL | Freq: Once | INTRAVENOUS | Status: AC | PRN
  Administered 2015-05-30: 13 mL via INTRAVENOUS

## 2015-06-03 ENCOUNTER — Telehealth: Payer: Self-pay | Admitting: *Deleted

## 2015-06-03 DIAGNOSIS — Z9889 Other specified postprocedural states: Principal | ICD-10-CM

## 2015-06-03 DIAGNOSIS — Z8679 Personal history of other diseases of the circulatory system: Secondary | ICD-10-CM

## 2015-06-03 NOTE — Telephone Encounter (Signed)
-----   Message from Keenan BachelorKatherine R Annas, [place] sent at 06/02/2015 10:39 AM EDT ----- Regarding: referral to cardiovascular surgeon Per Dr. Velvet BatheF "Tell patient her MRI of the breast was negative. It did note that her thoracic aorta looked a little more prominent and they had recommended repeating it in a year. I would recommend seeing a cardiothoracic surgeon who deals with things like this and get their recommendation. Set up an appointment with cardiothoracic/vascular surgeon and let them review the report and make their recommendations."  I spoke with patient and she knows you will be getting her set up. Thanks!!

## 2015-06-03 NOTE — Telephone Encounter (Signed)
Called [facility] at [phone] spoke with cindy and was informed to place referral in epic and she will contact pt to schedule, and call me with time and date.

## 2015-06-04 ENCOUNTER — Encounter: Payer: BLUE CROSS/BLUE SHIELD | Admitting: Surgery

## 2015-06-04 NOTE — Telephone Encounter (Signed)
Per Arline Aspindy Patient is set up with [facility].

## 2015-06-11 ENCOUNTER — Encounter: Payer: Self-pay | Admitting: Surgery

## 2015-06-11 ENCOUNTER — Institutional Professional Consult (permissible substitution) (INDEPENDENT_AMBULATORY_CARE_PROVIDER_SITE_OTHER): Payer: BLUE CROSS/BLUE SHIELD | Admitting: Surgery

## 2015-06-11 VITALS — BP 124/87 | HR 72 | Resp 16 | Ht 65.0 in | Wt 148.0 lb

## 2015-06-11 DIAGNOSIS — I712 Thoracic aortic aneurysm, without rupture, unspecified: Secondary | ICD-10-CM

## 2015-06-11 NOTE — Progress Notes (Signed)
Cardiothoracic Surgery Consultation  PCP is Lise Auer, MD Referring Provider is Fontaine, Nadyne Coombes, MD  Chief Complaint  Patient presents with  . Thoracic Aortic Aneurysm    Surgical eval on ascending thoracic aorta measuring approximately 4.0 cm seen on  BILATERAL BREAST MRI WITH AND WITHOUT CONTRAST from 05/30/15    HPI:  The patient is a 55 year old woman with a strong family history of breast cancer who underwent a breast MRI recently which showed an incidental 4.0 cm fusiform ascending aortic aneurysm. She has no cardiac history and no chest pain or shortness of breath.    Past Medical History  Diagnosis Date  . Asthma     Past Surgical History  Procedure Laterality Date  . Cesarean section  1993    Family History  Problem Relation Age of Onset  . Breast cancer Mother 41    after menopause  . Breast cancer Sister 52    inflammatory breast cancer; pre-menopausal  . Prostate cancer Brother 45  . Heart attack Maternal Uncle 40  . Breast cancer Cousin 40  . Hodgkin's lymphoma Cousin     dx in her teens and went on to develop breast cancer  . Bladder Cancer Cousin     non-smoker    Social History Social History  Substance Use Topics  . Smoking status: Never Smoker   . Smokeless tobacco: Never Used  . Alcohol Use: 0.0 oz/week    0 Standard drinks or equivalent per week     Comment: socially    Current Outpatient Prescriptions  Medication Sig Dispense Refill  . albuterol (VENTOLIN HFA) 108 (90 Base) MCG/ACT inhaler Inhale 2 puffs into the lungs as needed for wheezing or shortness of breath.    . fluticasone furoate-vilanterol (BREO ELLIPTA) 200-25 MCG/INH AEPB Inhale 1 puff into the lungs daily.     No current facility-administered medications for this visit.    No Known Allergies  Review of Systems  Constitutional: Negative.   HENT: Negative.   Eyes: Negative.   Respiratory: Negative for shortness of breath.        Asthma  Cardiovascular:  Negative for chest pain.  Gastrointestinal: Negative.   Endocrine: Negative.   Genitourinary: Negative.   Musculoskeletal: Negative.   Skin: Negative.   Allergic/Immunologic: Negative.   Neurological: Negative.   Hematological: Negative.   Psychiatric/Behavioral: Negative.     BP 124/87 mmHg  Pulse 72  Resp 16  Ht  (1.651 m)  Wt 148 lb (67.132 kg)  BMI 24.63 kg/m2  SpO2 97% Physical Exam  Constitutional: She is oriented to person, place, and time. She appears well-developed and well-nourished. No distress.  HENT:  Head: Normocephalic and atraumatic.  Mouth/Throat: Oropharynx is clear and moist.  Eyes: EOM are normal. Pupils are equal, round, and reactive to light.  Neck: Normal range of motion. Neck supple. No JVD present. No thyromegaly present.  Cardiovascular: Normal rate, regular rhythm and normal heart sounds.   No murmur heard. Pulmonary/Chest: Effort normal and breath sounds normal. No respiratory distress. She has no wheezes.  Musculoskeletal: She exhibits no edema.  Neurological: She is alert and oriented to person, place, and time.  Skin: Skin is warm and dry.  Psychiatric: She has a normal mood and affect.    Diagnostic Tests:  CLINICAL DATA: 55 year old for high risk screening. Strong family history of breast cancer in her mother at age 85 and her sister at age 2.  LABS: Not applicable.  EXAM: BILATERAL BREAST  MRI WITH AND WITHOUT CONTRAST  TECHNIQUE: Multiplanar, multisequence MR images of both breasts were obtained prior to and following the intravenous administration of 13 ml of Multihance.  THREE-DIMENSIONAL MR IMAGE RENDERING ON INDEPENDENT WORKSTATION:  Three-dimensional MR images were rendered by post-processing of the original MR data on an independent workstation. The three-dimensional MR images were interpreted, and findings are reported in the following complete MRI report for this study. Three dimensional images were  evaluated at the independent DynaCad workstation.  COMPARISON: No prior MRI. Mammography 12/03/2014 and earlier.  FINDINGS: Breast composition: d. Extreme fibroglandular tissue.  Background parenchymal enhancement: Minimal.  Right breast: No mass or abnormal enhancement.  Left breast: Benign approximate 3 mm focus of enhancement in the upper inner left breast, anterior depth, demonstrating below threshold kinetics. No mass or abnormal enhancement.  Lymph nodes: No pathologic lymphadenopathy.  Ancillary findings: Ectatic ascending thoracic aorta measuring approximately 4.0 cm in maximal diameter.  IMPRESSION: 1. No MRI evidence of malignancy. 2. Ectatic ascending thoracic aorta measuring approximately 4.0 cm in maximal diameter. Recommend annual imaging followup by CTA or MRA. This recommendation follows 2010 ACCF/AHA/AATS/ACR/ASA/SCA/SCAI/SIR/STS/SVM Guidelines for the Diagnosis and Management of Patients with Thoracic Aortic Disease. Circulation. 2010; 121: W098-J191: [id number].  RECOMMENDATION: 1. Annual bilateral screening mammography due in November, 2017. Consider annual high risk MRI screening. 2. Please see above recommendation regarding the followup of the ectatic ascending thoracic aorta.  BI-RADS CATEGORY 2: Benign.   Electronically Signed  By: Hulan Saashomas Lawrence M.D.  On: 05/30/2015 09:12   Impression:  She has a 4.0 cm fusiform ascending aortic aneurysm that was found incidentally on an MRI of the breast. This study only shows the anterior part of the chest and therefore only the ascending aorta. Her BP is under good control. The current recommendation is to follow these annually with CTA or MRA of the chest and not to replace the aorta until it gets to 5.5 cm. I would recommend following this yearly for a couple years to establish stability and if it does not change then I would follow it every few years. I reviewed the MR with her and answered her  questions.  Plan:  I will see her back in one year with a CTA of the chest.   Alleen BorneBryan K Bartle, MD [facility] [phone]

## 2015-12-04 ENCOUNTER — Encounter: Payer: Self-pay | Admitting: *Deleted

## 2015-12-08 DIAGNOSIS — M25512 Pain in left shoulder: Secondary | ICD-10-CM | POA: Diagnosis not present

## 2015-12-08 DIAGNOSIS — M6281 Muscle weakness (generalized): Secondary | ICD-10-CM | POA: Diagnosis not present

## 2015-12-08 DIAGNOSIS — R2689 Other abnormalities of gait and mobility: Secondary | ICD-10-CM | POA: Diagnosis not present

## 2015-12-10 ENCOUNTER — Other Ambulatory Visit: Payer: Self-pay | Admitting: Gynecology

## 2015-12-10 DIAGNOSIS — M25512 Pain in left shoulder: Secondary | ICD-10-CM | POA: Diagnosis not present

## 2015-12-10 DIAGNOSIS — Z1231 Encounter for screening mammogram for malignant neoplasm of breast: Secondary | ICD-10-CM

## 2015-12-10 DIAGNOSIS — R2689 Other abnormalities of gait and mobility: Secondary | ICD-10-CM | POA: Diagnosis not present

## 2015-12-10 DIAGNOSIS — M6281 Muscle weakness (generalized): Secondary | ICD-10-CM | POA: Diagnosis not present

## 2015-12-15 DIAGNOSIS — M25512 Pain in left shoulder: Secondary | ICD-10-CM | POA: Diagnosis not present

## 2015-12-15 DIAGNOSIS — R2689 Other abnormalities of gait and mobility: Secondary | ICD-10-CM | POA: Diagnosis not present

## 2015-12-15 DIAGNOSIS — M6281 Muscle weakness (generalized): Secondary | ICD-10-CM | POA: Diagnosis not present

## 2015-12-15 DIAGNOSIS — J45909 Unspecified asthma, uncomplicated: Secondary | ICD-10-CM | POA: Diagnosis not present

## 2015-12-15 DIAGNOSIS — Z1389 Encounter for screening for other disorder: Secondary | ICD-10-CM | POA: Diagnosis not present

## 2015-12-15 DIAGNOSIS — Z7951 Long term (current) use of inhaled steroids: Secondary | ICD-10-CM | POA: Diagnosis not present

## 2015-12-16 DIAGNOSIS — E1165 Type 2 diabetes mellitus with hyperglycemia: Secondary | ICD-10-CM | POA: Diagnosis not present

## 2015-12-16 DIAGNOSIS — Z0000 Encounter for general adult medical examination without abnormal findings: Secondary | ICD-10-CM | POA: Diagnosis not present

## 2015-12-24 DIAGNOSIS — M6281 Muscle weakness (generalized): Secondary | ICD-10-CM | POA: Diagnosis not present

## 2015-12-24 DIAGNOSIS — R2689 Other abnormalities of gait and mobility: Secondary | ICD-10-CM | POA: Diagnosis not present

## 2015-12-24 DIAGNOSIS — M25512 Pain in left shoulder: Secondary | ICD-10-CM | POA: Diagnosis not present

## 2015-12-31 DIAGNOSIS — M6281 Muscle weakness (generalized): Secondary | ICD-10-CM | POA: Diagnosis not present

## 2015-12-31 DIAGNOSIS — M25512 Pain in left shoulder: Secondary | ICD-10-CM | POA: Diagnosis not present

## 2015-12-31 DIAGNOSIS — R2689 Other abnormalities of gait and mobility: Secondary | ICD-10-CM | POA: Diagnosis not present

## 2016-01-07 DIAGNOSIS — M25512 Pain in left shoulder: Secondary | ICD-10-CM | POA: Diagnosis not present

## 2016-01-07 DIAGNOSIS — M6281 Muscle weakness (generalized): Secondary | ICD-10-CM | POA: Diagnosis not present

## 2016-01-07 DIAGNOSIS — R2689 Other abnormalities of gait and mobility: Secondary | ICD-10-CM | POA: Diagnosis not present

## 2016-01-14 DIAGNOSIS — R2689 Other abnormalities of gait and mobility: Secondary | ICD-10-CM | POA: Diagnosis not present

## 2016-01-14 DIAGNOSIS — M25512 Pain in left shoulder: Secondary | ICD-10-CM | POA: Diagnosis not present

## 2016-01-14 DIAGNOSIS — M6281 Muscle weakness (generalized): Secondary | ICD-10-CM | POA: Diagnosis not present

## 2016-01-15 ENCOUNTER — Ambulatory Visit
Admission: RE | Admit: 2016-01-15 | Discharge: 2016-01-15 | Disposition: A | Discharge status: Home / Self Care | Payer: BLUE CROSS/BLUE SHIELD | Source: Ambulatory Visit | Attending: Gynecology | Admitting: Gynecology

## 2016-01-15 DIAGNOSIS — Z1231 Encounter for screening mammogram for malignant neoplasm of breast: Secondary | ICD-10-CM | POA: Diagnosis not present

## 2016-01-21 DIAGNOSIS — M25512 Pain in left shoulder: Secondary | ICD-10-CM | POA: Diagnosis not present

## 2016-01-21 DIAGNOSIS — M6281 Muscle weakness (generalized): Secondary | ICD-10-CM | POA: Diagnosis not present

## 2016-01-21 DIAGNOSIS — R2689 Other abnormalities of gait and mobility: Secondary | ICD-10-CM | POA: Diagnosis not present

## 2016-02-05 ENCOUNTER — Ambulatory Visit (INDEPENDENT_AMBULATORY_CARE_PROVIDER_SITE_OTHER): Payer: BLUE CROSS/BLUE SHIELD | Admitting: Gynecology

## 2016-02-05 ENCOUNTER — Encounter: Payer: Self-pay | Admitting: Gynecology

## 2016-02-05 VITALS — BP 120/76 | Ht 65.0 in | Wt 144.0 lb

## 2016-02-05 DIAGNOSIS — Z01411 Encounter for gynecological examination (general) (routine) with abnormal findings: Secondary | ICD-10-CM | POA: Diagnosis not present

## 2016-02-05 DIAGNOSIS — M8580 Other specified disorders of bone density and structure, unspecified site: Secondary | ICD-10-CM | POA: Diagnosis not present

## 2016-02-05 NOTE — Patient Instructions (Signed)

## 2016-02-05 NOTE — Progress Notes (Signed)
    Gaynelle AduKaren Huxford 06/24/1960 [id number]        56 y.o.  G1P1 for annual exam.    Past medical history,surgical history, problem list, medications, allergies, family history and social history were all reviewed and documented as reviewed in the EPIC chart.  ROS:  Performed with pertinent positives and negatives included in the history, assessment and plan.   Additional significant findings :  None   Exam: Kennon PortelaKim Gardner assistant Vitals:   02/05/16 0835  BP: 120/76  Weight: 144 lb (65.3 kg)  Height: 5\' 5"  (1.651 m)   Body mass index is 23.96 kg/m.  General appearance:  Normal affect, orientation and appearance. Skin: Grossly normal HEENT: Without gross lesions.  No cervical or supraclavicular adenopathy. Thyroid normal.  Lungs:  Clear without wheezing, rales or rhonchi Cardiac: RR, without RMG Abdominal:  Soft, nontender, without masses, guarding, rebound, organomegaly or hernia Breasts:  Examined lying and sitting without masses, retractions, discharge or axillary adenopathy. Pelvic:  Ext, BUS, Vagina with atrophic changes  Cervix normal  Uterus anteverted, normal size, shape and contour, midline and mobile nontender   Adnexa without masses or tenderness    Anus and perineum normal   Rectovaginal normal sphincter tone without palpated masses or tenderness.    Assessment/Plan:  56 y.o. G1P1 female for annual exam.   1. Postmenopausal/atrophic genital changes. No significant hot flushes, night sweats, vaginal dryness, vaginal bleeding. Continue to monitor report any issues or bleeding. 2. Mammography 12/2015.  Strong family history of breast cancer. Genetic counselor calculation of lifetime risk of breast cancer 21% through 39%. Had MRI screening 05/2015. Discussed continued annual MRI screening along with continued annual mammography is. Patient has a follow up with cardiology in reference to generous dilatation of aorta noted on last MRI. She's going to discuss with them when  they schedule a follow up MRI if they will do a breast MRI the same time. If any issues arranging that she knows to call me. SBE monthly reviewed. 3. Pap smear/HPV 2015 negative. No Pap smear done today. No history of abnormal Pap smears. Plan repeat Pap smear at 5 year interval per current screening guidelines. 4. DEXA this past year at her primary physician's office. Reports being told it was little low in the osteopenic range. Do not have a copy of this region continue to follow up [facility]. 5. Colonoscopy 2014. Repeat at their recommended interval. 6. Health maintenance. No routine lab work done as patient does this elsewhere. Follow up 1 year, sooner as needed.   Dara LordsFONTAINE,Nandita Mathenia P MD, 9:08 AM 02/05/2016

## 2016-02-23 DIAGNOSIS — R2689 Other abnormalities of gait and mobility: Secondary | ICD-10-CM | POA: Diagnosis not present

## 2016-02-23 DIAGNOSIS — M6281 Muscle weakness (generalized): Secondary | ICD-10-CM | POA: Diagnosis not present

## 2016-02-23 DIAGNOSIS — M25512 Pain in left shoulder: Secondary | ICD-10-CM | POA: Diagnosis not present

## 2016-05-04 ENCOUNTER — Encounter: Payer: Self-pay | Admitting: Surgery

## 2016-05-21 DIAGNOSIS — Z6824 Body mass index (BMI) 24.0-24.9, adult: Secondary | ICD-10-CM | POA: Diagnosis not present

## 2016-05-21 DIAGNOSIS — R51 Headache: Secondary | ICD-10-CM | POA: Diagnosis not present

## 2016-05-21 DIAGNOSIS — R270 Ataxia, unspecified: Secondary | ICD-10-CM | POA: Diagnosis not present

## 2016-05-24 DIAGNOSIS — R270 Ataxia, unspecified: Secondary | ICD-10-CM | POA: Diagnosis not present

## 2016-05-24 DIAGNOSIS — Z6824 Body mass index (BMI) 24.0-24.9, adult: Secondary | ICD-10-CM | POA: Diagnosis not present

## 2016-05-28 DIAGNOSIS — R51 Headache: Secondary | ICD-10-CM | POA: Diagnosis not present

## 2016-05-28 DIAGNOSIS — R270 Ataxia, unspecified: Secondary | ICD-10-CM | POA: Diagnosis not present

## 2016-06-01 DIAGNOSIS — R270 Ataxia, unspecified: Secondary | ICD-10-CM | POA: Diagnosis not present

## 2016-06-01 DIAGNOSIS — I6523 Occlusion and stenosis of bilateral carotid arteries: Secondary | ICD-10-CM | POA: Diagnosis not present

## 2016-06-10 ENCOUNTER — Other Ambulatory Visit: Payer: Self-pay | Admitting: *Deleted

## 2016-06-10 DIAGNOSIS — I7121 Aneurysm of the ascending aorta, without rupture: Secondary | ICD-10-CM

## 2016-06-10 DIAGNOSIS — I712 Thoracic aortic aneurysm, without rupture: Secondary | ICD-10-CM

## 2016-06-16 ENCOUNTER — Encounter: Payer: BLUE CROSS/BLUE SHIELD | Admitting: Surgery

## 2016-06-30 ENCOUNTER — Ambulatory Visit (INDEPENDENT_AMBULATORY_CARE_PROVIDER_SITE_OTHER): Payer: BLUE CROSS/BLUE SHIELD | Admitting: Surgery

## 2016-06-30 ENCOUNTER — Encounter: Payer: Self-pay | Admitting: Surgery

## 2016-06-30 ENCOUNTER — Ambulatory Visit
Admission: RE | Admit: 2016-06-30 | Discharge: 2016-06-30 | Disposition: A | Discharge status: Home / Self Care | Payer: BLUE CROSS/BLUE SHIELD | Source: Ambulatory Visit | Attending: Surgery | Admitting: Surgery

## 2016-06-30 VITALS — BP 110/78 | HR 82 | Resp 16 | Ht 65.0 in | Wt 147.0 lb

## 2016-06-30 DIAGNOSIS — I7121 Aneurysm of the ascending aorta, without rupture: Secondary | ICD-10-CM

## 2016-06-30 DIAGNOSIS — I712 Thoracic aortic aneurysm, without rupture, unspecified: Secondary | ICD-10-CM

## 2016-06-30 MED ORDER — IOPAMIDOL (ISOVUE-370) INJECTION 76%
75.0000 mL | Freq: Once | INTRAVENOUS | Status: AC | PRN
  Administered 2016-06-30: 75 mL via INTRAVENOUS

## 2016-07-01 ENCOUNTER — Encounter: Payer: Self-pay | Admitting: Surgery

## 2016-07-01 NOTE — Progress Notes (Signed)
HPI:  The patient returns today for follow up of a 4.0 cm fusiform ascending aortic aneurysm that was incidentally found on a breast MRI last year. She is otherwise healthy except for some asthma. She does not have HTN. She has been doing well over the past year and has not had any chest or back pain.  Current Outpatient Prescriptions  Medication Sig Dispense Refill  . albuterol (VENTOLIN HFA) 108 (90 Base) MCG/ACT inhaler Inhale 2 puffs into the lungs as needed for wheezing or shortness of breath.    . fluticasone furoate-vilanterol (BREO ELLIPTA) 200-25 MCG/INH AEPB Inhale 1 puff into the lungs daily.     No current facility-administered medications for this visit.      Physical Exam: BP 110/78 (BP Location: Right Arm, Patient Position: Sitting, Cuff Size: Large)   Pulse 82   Resp 16   Ht 5\' 5"  (1.651 m)   Wt 147 lb (66.7 kg)   SpO2 97% Comment: ON RA  BMI 24.46 kg/m  She looks well Cardiac exam shows a regular rate and rhythm with normal heart sounds Lungs are clear  Diagnostic Tests:  CLINICAL DATA:  Ascending thoracic aortic aneurysm  EXAM: CT ANGIOGRAPHY CHEST WITH CONTRAST  TECHNIQUE: Multidetector CT imaging of the chest was performed using the standard protocol during bolus administration of intravenous contrast. Multiplanar CT image reconstructions and MIPs were obtained to evaluate the vascular anatomy.  CONTRAST:  75 cc Isovue 370  COMPARISON:  05/30/2015 breast MRI  FINDINGS: Cardiovascular: Fusiform aneurysmal dilatation of the ascending thoracic aorta, maximal AP dimension 4 cm and transverse dimension 4 cm. Measurements are stable compared 05/30/2015 breast MRI. No evidence of dissection, mediastinal hemorrhage, or hematoma. Three-vessel arch anatomy appearing patent. Minor atherosclerotic change of the descending thoracic aorta with a normal caliber, measuring 2.4 cm in AP dimension.  Central pulmonary arteries are patent. No  significant proximal or central pulmonary embolus appreciated. Normal heart size. No pericardial effusion.  Mediastinum/Nodes: No enlarged mediastinal, hilar, or axillary lymph nodes. Thyroid gland, trachea, and esophagus demonstrate no significant findings.  Lungs/Pleura: No focal pneumonia, collapse or consolidation. No acute airspace process, interstitial disease or edema. Minor scarring or atelectasis in the inferior right middle lobe anteriorly.  Indeterminate peripheral 3 mm noncalcified nodule left upper lobe, image 36 series 5.  No pleural abnormality, pleural effusion, or pneumothorax.  Upper Abdomen: No acute abnormality.  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 Minor thoracic degenerative spondylosis. No acute osseous finding or compression fracture. Sternum intact.  Review of the MIP images confirms the above findings.  IMPRESSION: Stable mild fusiform aneurysm dilatation of the ascending thoracic aorta, maximal dimensions 4 cm.  Recommend annual imaging followup by CTA or MRA. This recommendation follows 2010 ACCF/AHA/AATS/ACR/ASA/SCA/SCAI/SIR/STS/SVM Guidelines for the Diagnosis and Management of Patients with Thoracic Aortic Disease. Circulation. 2010; 121: e266-e369  3 mm noncalcified left upper lobe subpleural nodule.  No follow-up needed if patient is low-risk. Non-contrast chest CT can be considered in 12 months if patient is high-risk. This recommendation follows the consensus statement: Guidelines for Management of Incidental Pulmonary Nodules Detected on CT Images: From the Fleischner Society 2017; Radiology 2017; 284:228-243.  No other acute intrathoracic process.   Electronically Signed   By: Judie PetitM.  Shick M.D.   On: 06/30/2016 16:08   Impression:  The fusiform ascending aortic aneurysm is stable at 4 cm. This is well below the surgical threshold for most patients of 5.5 cm. Her BP is normal. This  should be followed yearly with CT for a few years to establish stability. She has a 3 mm noncalcified LUL subpleural nodule that may be a lymph node. She is a nonsmoker and at low risk for lung cancer so I do not think this needs followup but she is going to have a CT of the chest in one year to check the aneurysm so we will be able to assess this at that time. I reviewed the CT images with her and answered her questions.  Plan:  Follow up in one year with a CTA of the chest.  I spent 15 minutes performing this established patient evaluation and > 50% of this time was spent face to face counseling and coordinating the care of this patient's aortic aneurysm.    Alleen Borne, MD [facility] [phone]

## 2016-07-13 DIAGNOSIS — D1800 Hemangioma unspecified site: Secondary | ICD-10-CM | POA: Diagnosis not present

## 2016-07-13 DIAGNOSIS — L821 Other seborrheic keratosis: Secondary | ICD-10-CM | POA: Diagnosis not present

## 2016-07-13 DIAGNOSIS — D225 Melanocytic nevi of trunk: Secondary | ICD-10-CM | POA: Diagnosis not present

## 2016-07-13 DIAGNOSIS — L814 Other melanin hyperpigmentation: Secondary | ICD-10-CM | POA: Diagnosis not present

## 2016-11-30 ENCOUNTER — Other Ambulatory Visit: Payer: Self-pay | Admitting: Gynecology

## 2016-11-30 DIAGNOSIS — Z1231 Encounter for screening mammogram for malignant neoplasm of breast: Secondary | ICD-10-CM

## 2017-01-20 ENCOUNTER — Ambulatory Visit
Admission: RE | Admit: 2017-01-20 | Discharge: 2017-01-20 | Disposition: A | Discharge status: Home / Self Care | Payer: BLUE CROSS/BLUE SHIELD | Source: Ambulatory Visit | Attending: Gynecology | Admitting: Gynecology

## 2017-01-20 DIAGNOSIS — Z1231 Encounter for screening mammogram for malignant neoplasm of breast: Secondary | ICD-10-CM

## 2017-02-08 ENCOUNTER — Ambulatory Visit (INDEPENDENT_AMBULATORY_CARE_PROVIDER_SITE_OTHER): Payer: BLUE CROSS/BLUE SHIELD | Admitting: Gynecology

## 2017-02-08 ENCOUNTER — Encounter: Payer: Self-pay | Admitting: Gynecology

## 2017-02-08 VITALS — BP 120/76 | Ht 65.0 in | Wt 150.0 lb

## 2017-02-08 DIAGNOSIS — N952 Postmenopausal atrophic vaginitis: Secondary | ICD-10-CM | POA: Diagnosis not present

## 2017-02-08 DIAGNOSIS — N941 Unspecified dyspareunia: Secondary | ICD-10-CM

## 2017-02-08 DIAGNOSIS — M8580 Other specified disorders of bone density and structure, unspecified site: Secondary | ICD-10-CM

## 2017-02-08 DIAGNOSIS — Z01411 Encounter for gynecological examination (general) (routine) with abnormal findings: Secondary | ICD-10-CM

## 2017-02-08 NOTE — Patient Instructions (Signed)
Follow-up in 1 year, sooner as needed. 

## 2017-02-08 NOTE — Progress Notes (Signed)
    Kimberly Schwartz 05/26/1960 [id number]        57 y.o.  G1P1 for annual gynecologic exam.  Notes some issues with discomfort with intercourse.  Not having daily vaginal dryness but just with intercourse.  Has tried some OTC lubricants without benefit.  Past medical history,surgical history, problem list, medications, allergies, family history and social history were all reviewed and documented as reviewed in the EPIC chart.  ROS:  Performed with pertinent positives and negatives included in the history, assessment and plan.   Additional significant findings : None   Exam: Kennon PortelaKim Gardner assistant Vitals:   02/08/17 1206  BP: 120/76  Weight: 150 lb (68 kg)  Height: 5\' 5"  (1.651 m)   Body mass index is 24.96 kg/m.  General appearance:  Normal affect, orientation and appearance. Skin: Grossly normal HEENT: Without gross lesions.  No cervical or supraclavicular adenopathy. Thyroid normal.  Lungs:  Clear without wheezing, rales or rhonchi Cardiac: RR, without RMG Abdominal:  Soft, nontender, without masses, guarding, rebound, organomegaly or hernia Breasts:  Examined lying and sitting without masses, retractions, discharge or axillary adenopathy. Pelvic:  Ext, BUS, Vagina: Normal with atrophic changes  Cervix: Normal with atrophic changes  Uterus: Anteverted, normal size, shape and contour, midline and mobile nontender   Adnexa: Without masses or tenderness    Anus and perineum: Normal   Rectovaginal: Normal sphincter tone without palpated masses or tenderness.    Assessment/Plan:  57 y.o. G1P1 female for annual gynecologic exam.   1. Postmenopausal/atrophic genital changes.  Having some dyspareunia.  Not daily dryness.  No significant hot flushes or night sweats.  No vaginal bleeding.  Reviewed various options to include trials of oil-based lubricants versus trial of vaginal estrogen.  I discussed was involved with vaginal estrogen and the various products available.  Risks of  absorption with systemic effects to include thrombosis, breast and endometrial stimulation reviewed.  At this point patient wants to try oil-based and will follow-up if she wants to start vaginal estrogen. 2. Mammography 12/2016.  Strong family history of breast cancer.  Genetic counseling calculated her risk at 21% through 39% depending on model used.  Has been doing MRIs also.  Is coming due for MRI in May and she is going to call to schedule.  She will call our office if she has any issues arranging.  Breast exam normal today.  Repeat mammogram next December. 3. Pap smear/HPV 2015.  No Pap smear done today.  No history of abnormal Pap smears.  Plan repeat Pap smear next year at 4-5-year interval per current screening guidelines. 4. Colonoscopy 2014.  Repeat at their recommended interval. 5. DEXA 2017 T score -2.2.  Is supplementing calcium and vitamin D.  Recommended repeat DEXA this coming year at a 2-year interval.  She has this done through her primary physician's office and will continue to follow-up with them in reference to bone health. 6. Health maintenance.  No routine lab work done as patient reports this done elsewhere.  Follow-up 1 year, sooner as needed.   Dara Lordsimothy P Muhammed Teutsch MD, 2:05 PM 02/08/2017

## 2017-06-23 ENCOUNTER — Other Ambulatory Visit: Payer: Self-pay | Admitting: Surgery

## 2017-06-23 DIAGNOSIS — I712 Thoracic aortic aneurysm, without rupture, unspecified: Secondary | ICD-10-CM

## 2017-07-19 ENCOUNTER — Ambulatory Visit
Admission: RE | Admit: 2017-07-19 | Discharge: 2017-07-19 | Disposition: A | Discharge status: Home / Self Care | Payer: BLUE CROSS/BLUE SHIELD | Source: Ambulatory Visit | Attending: Surgery | Admitting: Surgery

## 2017-07-19 DIAGNOSIS — I712 Thoracic aortic aneurysm, without rupture, unspecified: Secondary | ICD-10-CM

## 2017-07-19 MED ORDER — IOPAMIDOL (ISOVUE-370) INJECTION 76%
75.0000 mL | Freq: Once | INTRAVENOUS | Status: AC | PRN
  Administered 2017-07-19: 75 mL via INTRAVENOUS

## 2017-07-20 ENCOUNTER — Ambulatory Visit (INDEPENDENT_AMBULATORY_CARE_PROVIDER_SITE_OTHER): Payer: No Typology Code available for payment source | Admitting: Surgery

## 2017-07-20 ENCOUNTER — Other Ambulatory Visit: Payer: Self-pay

## 2017-07-20 ENCOUNTER — Encounter: Payer: Self-pay | Admitting: Surgery

## 2017-07-20 VITALS — BP 111/74 | HR 98 | Resp 16 | Ht 65.0 in | Wt 144.0 lb

## 2017-07-20 DIAGNOSIS — I7121 Aneurysm of the ascending aorta, without rupture: Secondary | ICD-10-CM

## 2017-07-20 DIAGNOSIS — I712 Thoracic aortic aneurysm, without rupture: Secondary | ICD-10-CM | POA: Diagnosis not present

## 2017-07-20 NOTE — Progress Notes (Signed)
HPI:  The patient returns today for follow up of a 4.0 cm fusiform ascending aortic aneurysm that was incidentally found on a breast MRI.  Since I saw her last year she has been feeling well without chest or back pain.  She is very physically active.    Current Outpatient Medications  Medication Sig Dispense Refill  . albuterol (VENTOLIN HFA) 108 (90 Base) MCG/ACT inhaler Inhale 2 puffs into the lungs as needed for wheezing or shortness of breath.    . fluticasone furoate-vilanterol (BREO ELLIPTA) 200-25 MCG/INH AEPB Inhale 1 puff into the lungs daily.     No current facility-administered medications for this visit.      Physical Exam: BP 111/74 (BP Location: Right Arm, Patient Position: Sitting, Cuff Size: Normal)   Pulse 98   Resp 16   Ht 5\' 5"  (1.651 m)   Wt 144 lb (65.3 kg)   SpO2 94% Comment: ON RA  BMI 23.96 kg/m  She looks well. Cardiac exam shows a regular rate and rhythm with normal heart sounds.  There is no murmur. Lungs are clear.  Diagnostic Tests:  CLINICAL DATA:  Thoracic aortic aneurysm  EXAM: CT ANGIOGRAPHY CHEST WITH CONTRAST  TECHNIQUE: Multidetector CT imaging of the chest was performed using the standard protocol during bolus administration of intravenous contrast. Multiplanar CT image reconstructions and MIPs were obtained to evaluate the vascular anatomy.  CONTRAST:  75mL ISOVUE-370 IOPAMIDOL (ISOVUE-370) INJECTION 76%  COMPARISON:  None.  FINDINGS: Cardiovascular: Ascending thoracic aorta demonstrates stable fusiform aneurysmal dilatation, maximal diameter 4 cm. No significant interval change, wall thickening, dissection, mediastinal hemorrhage. Patent 3 vessel arch anatomy. Central pulmonary arteries are patent. Normal heart size. No pericardial or pleural effusion. Stable pectus deformity.  Mediastinum/Nodes: No enlarged mediastinal, hilar, or axillary lymph nodes. Thyroid gland, trachea, and esophagus demonstrate  no significant findings.  Lungs/Pleura: Minor basilar atelectasis. Mild central bronchial wall thickening can be seen with bronchitis. No focal pneumonia, collapse or consolidation. Negative for edema or interstitial disease. No pleural abnormality, effusion or pneumothorax.  3 mm subpleural upper lobe nodule is partially calcified compatible with granuloma when compared to the prior study. No new or enlarging nodule.  Upper Abdomen: No acute abnormality.  Musculoskeletal: Degenerative changes of the spine. No acute osseous finding. Midline pectus deformity. Thoracic spine intact. Sternum intact.  Review of the MIP images confirms the above findings.  IMPRESSION: Stable 4 cm ascending thoracic aortic aneurysm.  No acute vascular finding.  No other acute intrathoracic process.   Electronically Signed   By: Judie PetitM.  Shick M.D.   On: 07/19/2017 18:09   Impression:  This 57 year old woman has a stable 4.0 cm fusiform ascending aortic aneurysm which is not changed compared to her CT scan one year ago.  Her descending thoracic aorta measures 1.9 cm.  Her blood pressure is under good control.  I stressed the importance of maintaining good blood pressure control and preventing further enlargement of the aneurysm and aortic dissection.  I reviewed the CT scan images with her and answered her questions.  I advised her against heavy lifting of more than 35 pounds that may result in a Valsalva maneuver.  Also advised her against taking quinolone antibiotics which have been associated with enlarging aneurysms.  She would like to wait for 2 years to repeat her CT scan since this is still relatively small and I think that is reasonable since this is been stable and her blood pressure is under good control.  I  would not wait any longer than that.  Plan:  I will see her back in 2 years with a CTA of the chest.   I spent 15 minutes performing this established patient evaluation and >  50% of this time was spent face to face counseling and coordinating the care of this patient's aortic aneurysm.    Alleen Borne, MD [facility] [phone]

## 2018-02-06 DIAGNOSIS — Z6824 Body mass index (BMI) 24.0-24.9, adult: Secondary | ICD-10-CM | POA: Diagnosis not present

## 2018-02-06 DIAGNOSIS — J101 Influenza due to other identified influenza virus with other respiratory manifestations: Secondary | ICD-10-CM | POA: Diagnosis not present

## 2018-02-20 ENCOUNTER — Other Ambulatory Visit: Payer: Self-pay | Admitting: Gynecology

## 2018-02-20 DIAGNOSIS — Z1231 Encounter for screening mammogram for malignant neoplasm of breast: Secondary | ICD-10-CM

## 2018-03-24 ENCOUNTER — Ambulatory Visit
Admission: RE | Admit: 2018-03-24 | Discharge: 2018-03-24 | Disposition: A | Discharge status: Home / Self Care | Payer: BLUE CROSS/BLUE SHIELD | Source: Ambulatory Visit | Attending: Gynecology | Admitting: Gynecology

## 2018-03-24 DIAGNOSIS — Z1231 Encounter for screening mammogram for malignant neoplasm of breast: Secondary | ICD-10-CM | POA: Diagnosis not present

## 2018-03-28 ENCOUNTER — Encounter: Payer: Self-pay | Admitting: Gynecology

## 2018-03-28 ENCOUNTER — Ambulatory Visit (INDEPENDENT_AMBULATORY_CARE_PROVIDER_SITE_OTHER): Payer: BLUE CROSS/BLUE SHIELD | Admitting: Gynecology

## 2018-03-28 VITALS — BP 118/76 | Ht 65.0 in | Wt 146.0 lb

## 2018-03-28 DIAGNOSIS — N952 Postmenopausal atrophic vaginitis: Secondary | ICD-10-CM

## 2018-03-28 DIAGNOSIS — M8580 Other specified disorders of bone density and structure, unspecified site: Secondary | ICD-10-CM | POA: Diagnosis not present

## 2018-03-28 DIAGNOSIS — Z01419 Encounter for gynecological examination (general) (routine) without abnormal findings: Secondary | ICD-10-CM | POA: Diagnosis not present

## 2018-03-28 DIAGNOSIS — Z1151 Encounter for screening for human papillomavirus (HPV): Secondary | ICD-10-CM

## 2018-03-28 NOTE — Progress Notes (Signed)
    Kimberly Schwartz 12/16/1960 [id number]        58 y.o.  G1P1 for annual gynecologic exam.  Without gynecologic complaints  Past medical history,surgical history, problem list, medications, allergies, family history and social history were all reviewed and documented as reviewed in the EPIC chart.  ROS:  Performed with pertinent positives and negatives included in the history, assessment and plan.   Additional significant findings : None   Exam: Kennon Portela assistant Vitals:   03/28/18 1421  BP: 118/76  Weight: 146 lb (66.2 kg)  Height: 5\' 5"  (1.651 m)   Body mass index is 24.3 kg/m.  General appearance:  Normal affect, orientation and appearance. Skin: Grossly normal HEENT: Without gross lesions.  No cervical or supraclavicular adenopathy. Thyroid normal.  Lungs:  Clear without wheezing, rales or rhonchi Cardiac: RR, without RMG Abdominal:  Soft, nontender, without masses, guarding, rebound, organomegaly or hernia Breasts:  Examined lying and sitting without masses, retractions, discharge or axillary adenopathy. Pelvic:  Ext, BUS, Vagina: Normal with atrophic changes  Cervix: With atrophic changes.  Pap smear/HPV  Uterus: Anteverted, normal size, shape and contour, midline and mobile nontender   Adnexa: Without masses or tenderness    Anus and perineum: Normal   Rectovaginal: Normal sphincter tone without palpated masses or tenderness.    Assessment/Plan:  58 y.o. G1P1 female for annual gynecologic exam.   1. Postmenopausal.  No significant menopausal symptoms or any vaginal bleeding. 2. Mammography 02/2018.  Breast exam normal today.  Strong family history of breast cancer.  Genetic counseling calculated risk at 21% through 39% depending on the model used.  Has been doing MRIs along with her mammography but has not had one since 2017.  Patient is going to call me in 6 months which will put her 6 months from her mammogram and we will schedule the MRI for her.  She knows the  importance of calling the office to arrange. 3. Pap smear/HPV 2015.  Pap smear/HPV today.  No history of significant abnormal Pap smears. 4. Osteopenia.  DEXA 2017 T score -2.2.  Recommend follow-up DEXA now and she will schedule in follow-up for this. 5. Colonoscopy 2014.  Repeat at their recommended interval. 6. Health maintenance.  No routine lab work done as patient does this elsewhere.  Follow-up 1 year, sooner as needed.   Dara Lords MD, 2:52 PM 03/28/2018

## 2018-03-28 NOTE — Addendum Note (Signed)
Addended by: Dayna Barker on: 03/28/2018 03:18 PM   Modules accepted: Orders

## 2018-03-28 NOTE — Patient Instructions (Signed)
Follow up for your bone density as scheduled. 

## 2018-03-29 LAB — PAP IG AND HPV HIGH-RISK: HPV DNA High Risk: NOT DETECTED

## 2018-07-21 ENCOUNTER — Other Ambulatory Visit: Payer: Self-pay

## 2018-07-25 ENCOUNTER — Ambulatory Visit (INDEPENDENT_AMBULATORY_CARE_PROVIDER_SITE_OTHER): Payer: BC Managed Care – PPO

## 2018-07-25 ENCOUNTER — Other Ambulatory Visit: Payer: Self-pay | Admitting: Gynecology

## 2018-07-25 ENCOUNTER — Other Ambulatory Visit: Payer: Self-pay

## 2018-07-25 DIAGNOSIS — M8580 Other specified disorders of bone density and structure, unspecified site: Secondary | ICD-10-CM

## 2018-07-25 DIAGNOSIS — M810 Age-related osteoporosis without current pathological fracture: Secondary | ICD-10-CM | POA: Diagnosis not present

## 2018-07-25 DIAGNOSIS — Z780 Asymptomatic menopausal state: Secondary | ICD-10-CM

## 2018-07-27 ENCOUNTER — Other Ambulatory Visit: Payer: BC Managed Care – PPO

## 2018-07-27 ENCOUNTER — Other Ambulatory Visit: Payer: Self-pay | Admitting: Gynecology

## 2018-07-27 ENCOUNTER — Encounter: Payer: Self-pay | Admitting: Gynecology

## 2018-07-27 ENCOUNTER — Other Ambulatory Visit: Payer: Self-pay

## 2018-07-27 DIAGNOSIS — M810 Age-related osteoporosis without current pathological fracture: Secondary | ICD-10-CM

## 2018-07-31 LAB — COMPREHENSIVE METABOLIC PANEL
AG Ratio: 1.9 (calc) (ref 1.0–2.5)
ALT: 16 U/L (ref 6–29)
AST: 23 U/L (ref 10–35)
Albumin: 4.3 g/dL (ref 3.6–5.1)
Alkaline phosphatase (APISO): 73 U/L (ref 37–153)
BUN: 19 mg/dL (ref 7–25)
CO2: 28 mmol/L (ref 20–32)
Calcium: 9.2 mg/dL (ref 8.6–10.4)
Chloride: 103 mmol/L (ref 98–110)
Creat: 0.76 mg/dL (ref 0.50–1.05)
Globulin: 2.3 g/dL (calc) (ref 1.9–3.7)
Glucose, Bld: 77 mg/dL (ref 65–99)
Potassium: 4.4 mmol/L (ref 3.5–5.3)
Sodium: 139 mmol/L (ref 135–146)
Total Bilirubin: 0.7 mg/dL (ref 0.2–1.2)
Total Protein: 6.6 g/dL (ref 6.1–8.1)

## 2018-07-31 LAB — PARATHYROID HORMONE, INTACT (NO CA): PTH: 28 pg/mL (ref 14–64)

## 2018-07-31 LAB — TSH: TSH: 2.91 mIU/L (ref 0.40–4.50)

## 2018-07-31 LAB — VITAMIN D 25 HYDROXY (VIT D DEFICIENCY, FRACTURES): Vit D, 25-Hydroxy: 46 ng/mL (ref 30–100)

## 2018-08-01 ENCOUNTER — Other Ambulatory Visit: Payer: BC Managed Care – PPO

## 2018-08-01 ENCOUNTER — Other Ambulatory Visit: Payer: Self-pay

## 2018-08-01 DIAGNOSIS — M810 Age-related osteoporosis without current pathological fracture: Secondary | ICD-10-CM | POA: Diagnosis not present

## 2018-08-03 LAB — CALCIUM, URINE, 24 HOUR: Calcium, 24H Urine: 162 mg/24 h

## 2018-08-07 ENCOUNTER — Other Ambulatory Visit: Payer: Self-pay

## 2018-08-08 ENCOUNTER — Encounter: Payer: Self-pay | Admitting: Gynecology

## 2018-08-08 ENCOUNTER — Ambulatory Visit (INDEPENDENT_AMBULATORY_CARE_PROVIDER_SITE_OTHER): Payer: BC Managed Care – PPO | Admitting: Gynecology

## 2018-08-08 VITALS — BP 120/76

## 2018-08-08 DIAGNOSIS — M810 Age-related osteoporosis without current pathological fracture: Secondary | ICD-10-CM | POA: Diagnosis not present

## 2018-08-08 NOTE — Progress Notes (Signed)
    Kimberly Schwartz 1960-12-28 [id number]        58 y.o.  G1P1 presents to discuss her most recent bone density which shows osteoporosis with T score -2.6 right femoral neck.  Left femoral neck -2.0.  Prior DEXA done elsewhere 2017 showed left femoral neck -2.2.  Past medical history,surgical history, problem list, medications, allergies, family history and social history were all reviewed and documented in the EPIC chart.  Directed ROS with pertinent positives and negatives documented in the history of present illness/assessment and plan.  Exam: Vitals:   08/08/18 1458  BP: 120/76   General appearance:  Normal   Assessment/Plan:  58 y.o. G1P1 with most recent bone density showing osteoporosis T score -2.6 in the right femoral neck.  T score -2.0 in the left femoral neck.  Spine was normal.  Secondary work-up showed vitamin D level 46 TSH, PTH and 24-hour urine collection for calcium excretion were all normal.  We discussed osteoporosis and reviewed her bone density report in detail.  I provided her a copy to have at home.  We discussed increased risk of fracture.  She is active working with a trainer twice weekly although admits to not taking calcium on a regular basis.  Options for management were reviewed to include increasing weightbearing activities, maximizing calcium at 1500 mg total dietary daily and adding additional 1000 units of vitamin D daily.  Restudying in 1 year for short interval study versus 2 years.  Also discussed medication at this time to include bisphosphate's, Prolia, Evista, Eventity and Forteo.  The pros and cons of each choice as well as risks discussed to include GERD, osteonecrosis of the jaw, atypical fractures, infections, rashes, thrombosis, cataracts and osteosarcoma.  Issues of ongoing bone loss with worsening T-scores and increasing risks of fracture reviewed.  The issues of fracture risk today versus fracture risk in 10 years if continues to worsen discussed also.   At this point the patient would prefer maximizing calcium and vitamin D, increasing weightbearing exercise and doing a short interval study in 1 year excepting the risk of ongoing bone loss.  She will call if she changes her mind and would like to start on alendronate which would be our choice to start.  I discussed with her how to take the medication and side effects to be aware of.  25 minutes of face-to-face discussion and record review were performed.    Anastasio Auerbach MD, 4:20 PM 08/08/2018

## 2018-08-08 NOTE — Patient Instructions (Signed)
Call if you change your mind and want a start on Fosamax.  Otherwise we will plan on you maximizing calcium and vitamin D intake, increasing weightbearing exercise and then we will recheck a bone density in 1 year.

## 2018-10-24 ENCOUNTER — Encounter: Payer: Self-pay | Admitting: Gynecology

## 2019-02-26 ENCOUNTER — Other Ambulatory Visit: Payer: Self-pay | Admitting: Obstetrics and Gynecology

## 2019-02-26 DIAGNOSIS — Z1231 Encounter for screening mammogram for malignant neoplasm of breast: Secondary | ICD-10-CM

## 2019-04-03 ENCOUNTER — Other Ambulatory Visit: Payer: Self-pay

## 2019-04-03 ENCOUNTER — Ambulatory Visit (INDEPENDENT_AMBULATORY_CARE_PROVIDER_SITE_OTHER): Payer: BC Managed Care – PPO | Admitting: Obstetrics and Gynecology

## 2019-04-03 ENCOUNTER — Encounter: Payer: Self-pay | Admitting: Obstetrics and Gynecology

## 2019-04-03 VITALS — BP 118/76 | Ht 64.5 in | Wt 145.0 lb

## 2019-04-03 DIAGNOSIS — Z01419 Encounter for gynecological examination (general) (routine) without abnormal findings: Secondary | ICD-10-CM | POA: Diagnosis not present

## 2019-04-03 DIAGNOSIS — M810 Age-related osteoporosis without current pathological fracture: Secondary | ICD-10-CM

## 2019-04-03 NOTE — Progress Notes (Signed)
   Kimberly Schwartz [birth year] [id number]  SUBJECTIVE:  59 y.o. G1P1 female for annual routine gynecologic exam. She has no gynecologic concerns.   Current Outpatient Medications  Medication Sig Dispense Refill  . albuterol (VENTOLIN HFA) 108 (90 Base) MCG/ACT inhaler Inhale 2 puffs into the lungs as needed for wheezing or shortness of breath.    . Calcium Carb-Cholecalciferol (CALCIUM 1000 + D PO) Take by mouth.    . fluticasone furoate-vilanterol (BREO ELLIPTA) 200-25 MCG/INH AEPB Inhale 1 puff into the lungs daily.     No current facility-administered medications for this visit.   Allergies: Patient has no known allergies.  No LMP recorded. Patient is postmenopausal.  Past medical history,surgical history, problem list, medications, allergies, family history and social history were all reviewed and documented as reviewed in the EPIC chart.  ROS:  Feeling well. No dyspnea or chest pain on exertion.  No abdominal pain, change in bowel habits, black or bloody stools.  No urinary tract symptoms. GYN ROS: no abnormal bleeding, pelvic pain or discharge, no breast pain or new or enlarging lumps on self exam. No neurological complaints.   OBJECTIVE:  BP 118/76   Ht 5' 4.5" (1.638 m)   Wt 145 lb (65.8 kg)   BMI 24.50 kg/m  The patient appears well, alert, oriented x 3, in no distress. ENT normal.  Neck supple. No cervical or supraclavicular adenopathy or thyromegaly.  Lungs are clear, good air entry, no wheezes, rhonchi or rales. S1 and S2 normal, no murmurs, regular rate and rhythm.  Abdomen soft without tenderness, guarding, mass or organomegaly.  Neurological is normal, no focal findings.  BREAST EXAM: breasts appear normal, no suspicious masses, no skin or nipple changes or axillary nodes  PELVIC EXAM: VULVA: normal appearing vulva with no masses, tenderness or lesions, VAGINA: normal appearing vagina with normal color and discharge, no lesions, CERVIX: normal appearing cervix  without discharge or lesions, UTERUS: uterus is normal size, shape, consistency and nontender, ADNEXA: normal adnexa in size, nontender and no masses  Chaperone: Kennon Portela present during the examination  ASSESSMENT:  59 y.o. G1P1 here for annual gynecologic exam  PLAN:   1. Postmenopausal.  No significant hot flashes.  No vaginal bleeding. 2. Pap smear 03/2018.  No significant history of abnormal Pap smears.  Next Pap smear due 2025 following the current guidelines recommending the 5 year interval. 3. Mammogram 02/2018.  Normal breast exam today.  She has an upcoming mammogram next week.  Strong family history of breast cancer.  Genetic counseling calculated risk at 21% through 39% depending on the model used.    Last breast MRI was 2017, I recommended that she try to schedule one this year, cost has been an issue.  We can have her schedule this when she comes in for her DEXA scan this summer. 4. Colonoscopy 2014.  Recommended that she follow up at the recommended interval.   5. Osteoporosis.  DEXA 07/2018.  T score -2.6 right femoral neck.  She had had a discussion regarding options for treatment.  She admitted that at that time she had not been consistent about taking calcium and vitamin D supplement, but now she has been.  Next DEXA recommended this year in June or July for 1 year follow-up so she plans to schedule this. 6. Health maintenance.  No labs today as she normally has these completed with her primary care provider.   Return annually or sooner, prn.  Theresia Majors MD  04/03/19

## 2019-04-06 ENCOUNTER — Ambulatory Visit
Admission: RE | Admit: 2019-04-06 | Discharge: 2019-04-06 | Disposition: A | Discharge status: Home / Self Care | Payer: BC Managed Care – PPO | Source: Ambulatory Visit | Attending: Obstetrics and Gynecology | Admitting: Obstetrics and Gynecology

## 2019-04-06 ENCOUNTER — Other Ambulatory Visit: Payer: Self-pay

## 2019-04-06 DIAGNOSIS — Z1231 Encounter for screening mammogram for malignant neoplasm of breast: Secondary | ICD-10-CM | POA: Diagnosis not present

## 2019-04-09 DIAGNOSIS — Z23 Encounter for immunization: Secondary | ICD-10-CM | POA: Diagnosis not present

## 2019-04-10 ENCOUNTER — Ambulatory Visit: Payer: BLUE CROSS/BLUE SHIELD

## 2019-05-14 DIAGNOSIS — Z23 Encounter for immunization: Secondary | ICD-10-CM | POA: Diagnosis not present

## 2019-07-05 ENCOUNTER — Other Ambulatory Visit: Payer: Self-pay | Admitting: *Deleted

## 2019-07-05 DIAGNOSIS — I712 Thoracic aortic aneurysm, without rupture, unspecified: Secondary | ICD-10-CM

## 2019-07-23 ENCOUNTER — Other Ambulatory Visit: Payer: Self-pay

## 2019-07-23 DIAGNOSIS — L570 Actinic keratosis: Secondary | ICD-10-CM | POA: Diagnosis not present

## 2019-07-23 DIAGNOSIS — L814 Other melanin hyperpigmentation: Secondary | ICD-10-CM | POA: Diagnosis not present

## 2019-07-23 DIAGNOSIS — L578 Other skin changes due to chronic exposure to nonionizing radiation: Secondary | ICD-10-CM | POA: Diagnosis not present

## 2019-07-23 DIAGNOSIS — L821 Other seborrheic keratosis: Secondary | ICD-10-CM | POA: Diagnosis not present

## 2019-07-23 DIAGNOSIS — D225 Melanocytic nevi of trunk: Secondary | ICD-10-CM | POA: Diagnosis not present

## 2019-07-24 ENCOUNTER — Ambulatory Visit (INDEPENDENT_AMBULATORY_CARE_PROVIDER_SITE_OTHER): Payer: BC Managed Care – PPO

## 2019-07-24 ENCOUNTER — Other Ambulatory Visit: Payer: Self-pay | Admitting: Obstetrics and Gynecology

## 2019-07-24 ENCOUNTER — Other Ambulatory Visit: Payer: Self-pay | Admitting: Obstetrics & Gynecology

## 2019-07-24 DIAGNOSIS — M8589 Other specified disorders of bone density and structure, multiple sites: Secondary | ICD-10-CM

## 2019-07-24 DIAGNOSIS — Z780 Asymptomatic menopausal state: Secondary | ICD-10-CM

## 2019-07-24 DIAGNOSIS — Z01419 Encounter for gynecological examination (general) (routine) without abnormal findings: Secondary | ICD-10-CM

## 2019-07-24 DIAGNOSIS — M810 Age-related osteoporosis without current pathological fracture: Secondary | ICD-10-CM

## 2019-08-01 ENCOUNTER — Ambulatory Visit (INDEPENDENT_AMBULATORY_CARE_PROVIDER_SITE_OTHER): Payer: BC Managed Care – PPO | Admitting: Surgery

## 2019-08-01 ENCOUNTER — Other Ambulatory Visit: Payer: Self-pay

## 2019-08-01 ENCOUNTER — Ambulatory Visit
Admission: RE | Admit: 2019-08-01 | Discharge: 2019-08-01 | Disposition: A | Discharge status: Home / Self Care | Payer: BC Managed Care – PPO | Source: Ambulatory Visit | Attending: Surgery | Admitting: Surgery

## 2019-08-01 ENCOUNTER — Encounter: Payer: Self-pay | Admitting: Surgery

## 2019-08-01 VITALS — BP 115/78 | HR 68 | Temp 97.6°F | Resp 20 | Ht 64.5 in | Wt 147.0 lb

## 2019-08-01 DIAGNOSIS — I712 Thoracic aortic aneurysm, without rupture, unspecified: Secondary | ICD-10-CM

## 2019-08-01 MED ORDER — IOPAMIDOL (ISOVUE-370) INJECTION 76%
75.0000 mL | Freq: Once | INTRAVENOUS | Status: AC | PRN
  Administered 2019-08-01: 75 mL via INTRAVENOUS

## 2019-08-01 NOTE — Progress Notes (Signed)
HPI:  The patient is a 59 year old woman who returns for follow-up of a 4.0 cm fusiform ascending aortic aneurysm.  I last saw her on 07/20/2017 at which time CT scan showed the aneurysm to be 4.0 cm and stable from 1 year prior.  She continues to remain active without any complaints.  Current Outpatient Medications  Medication Sig Dispense Refill  . albuterol (VENTOLIN HFA) 108 (90 Base) MCG/ACT inhaler Inhale 2 puffs into the lungs as needed for wheezing or shortness of breath.    . Calcium Carb-Cholecalciferol (CALCIUM 1000 + D PO) Take by mouth.    . fluticasone furoate-vilanterol (BREO ELLIPTA) 200-25 MCG/INH AEPB Inhale 1 puff into the lungs daily.     No current facility-administered medications for this visit.     Physical Exam: BP 115/78   Pulse 68   Temp 97.6 F (36.4 C) (Skin)   Resp 20   Ht 5' 4.5" (1.638 m)   Wt 147 lb (66.7 kg)   SpO2 97% Comment: RA  BMI 24.84 kg/m  She looks well. Cardiac exam shows a regular rate and rhythm with normal heart sounds.  There is no murmur. Lung exam is clear.   Diagnostic Tests:  Narrative & Impression  CLINICAL DATA:  Thoracic aortic aneurysm  EXAM: CT ANGIOGRAPHY CHEST WITH CONTRAST  TECHNIQUE: Multidetector CT imaging of the chest was performed using the standard protocol during bolus administration of intravenous contrast. Multiplanar CT image reconstructions and MIPs were obtained to evaluate the vascular anatomy.  CONTRAST:  40mL ISOVUE-370 IOPAMIDOL (ISOVUE-370) INJECTION 76%  Creatinine was obtained on site at [facility] at [address]  Results: Creatinine 1.0 mg/dL.  COMPARISON:  07/19/2017  FINDINGS: Cardiovascular: Ascending thoracic aorta demonstrates stable fusiform aneurysmal dilatation, maximal diameter unchanged 4 cm. No other acute vascular finding or dissection. No mediastinal hemorrhage or hematoma. No focal wall thickening. Patent 3 vessel arch anatomy. No  mediastinal hemorrhage or hematoma. Patent pulmonary arteries without significant filling defect or pulmonary embolus. Normal heart size. No pericardial effusion. Similar pectus deformity of the anterior chest.  Mediastinum/Nodes: No enlarged mediastinal, hilar, or axillary lymph nodes. Thyroid gland, trachea, and esophagus demonstrate no significant findings.  Lungs/Pleura: Minimal basilar atelectasis versus scarring. No focal pneumonia, collapse or consolidation. No suspicious pulmonary nodule or mass. Trachea and central airways are patent. No pleural abnormality, effusion or pneumothorax.  Upper Abdomen: No acute abnormality.  Musculoskeletal: Degenerative changes noted of the spine. No chest wall soft tissue asymmetry or focal abnormality. Midline pectus deformity.  Review of the MIP images confirms the above findings.  IMPRESSION: Stable 4 cm ascending thoracic aortic aneurysm.  No other acute vascular finding  No acute intrathoracic finding.  Aortic Atherosclerosis (ICD10-I70.0).   Electronically Signed   By: Judie Petit.  Shick M.D.   On: 08/01/2019 11:10      Impression:  She has a stable 4.0 cm fusiform ascending aortic aneurysm which is not changed since CT scan in 2018.  Her descending aorta at the same level measures 2.0 cm.  This is well below the usual surgical threshold of 5.5 cm although her descending aorta is on the small side so I would consider 5.0 cm to be significant enlargement.  I reviewed the CT images with her and answered her questions.  Her blood pressure is under good control and I stressed the importance of that to minimize the chance of further enlargement and acute aortic dissection.  Plan:  She will return to see me  in 2 years with a CTA of the chest to follow-up on her aneurysm.  I spent 10 minutes performing this established patient evaluation and > 50% of this time was spent face to face counseling and coordinating the care of this  patient's aortic aneurysm.    Alleen Borne, MD [facility] [phone]

## 2019-08-02 DIAGNOSIS — M810 Age-related osteoporosis without current pathological fracture: Secondary | ICD-10-CM | POA: Diagnosis not present

## 2019-08-02 DIAGNOSIS — Z13220 Encounter for screening for lipoid disorders: Secondary | ICD-10-CM | POA: Diagnosis not present

## 2019-08-02 DIAGNOSIS — Z0000 Encounter for general adult medical examination without abnormal findings: Secondary | ICD-10-CM | POA: Diagnosis not present

## 2019-08-02 DIAGNOSIS — Z23 Encounter for immunization: Secondary | ICD-10-CM | POA: Diagnosis not present

## 2019-08-02 DIAGNOSIS — Z6824 Body mass index (BMI) 24.0-24.9, adult: Secondary | ICD-10-CM | POA: Diagnosis not present

## 2019-08-02 DIAGNOSIS — E7800 Pure hypercholesterolemia, unspecified: Secondary | ICD-10-CM | POA: Diagnosis not present

## 2019-08-03 DIAGNOSIS — M810 Age-related osteoporosis without current pathological fracture: Secondary | ICD-10-CM | POA: Diagnosis not present

## 2019-11-14 DIAGNOSIS — Z23 Encounter for immunization: Secondary | ICD-10-CM | POA: Diagnosis not present

## 2019-11-14 DIAGNOSIS — E7800 Pure hypercholesterolemia, unspecified: Secondary | ICD-10-CM | POA: Diagnosis not present

## 2020-01-15 DIAGNOSIS — Z23 Encounter for immunization: Secondary | ICD-10-CM | POA: Diagnosis not present

## 2020-01-23 DIAGNOSIS — Z23 Encounter for immunization: Secondary | ICD-10-CM | POA: Diagnosis not present

## 2020-03-18 ENCOUNTER — Other Ambulatory Visit: Payer: Self-pay | Admitting: Obstetrics and Gynecology

## 2020-03-18 DIAGNOSIS — Z1231 Encounter for screening mammogram for malignant neoplasm of breast: Secondary | ICD-10-CM

## 2020-03-24 DIAGNOSIS — H02831 Dermatochalasis of right upper eyelid: Secondary | ICD-10-CM | POA: Diagnosis not present

## 2020-04-07 NOTE — Progress Notes (Signed)
60 y.o. G1P1 Married White or Caucasian female here for annual exam.     Has lost 16 lbs since summer using Noom. Feels good. Bone density increased fro -2.6 to -2.4 (06/2019). Is much more diligent about calcium intake. Strong family History of breast Cancer. Last breast MRI 2017. Is willing to do another.  Saw genetic counselor [phone]. Lifetime risk of breast cancer Tyrer Cusik= 21.3 % and Gail= 39.3 %. At that time pt declined BRCA testing. Sister's cancer was inflammatory breast cancer and was informed by genetic counselor that this type of cancer is not associated with BRCA mutations.  No LMP recorded. Patient is postmenopausal.          Sexually active: Yes.    The current method of family planning is post menopausal status.    Exercising: Yes.    strength training, cardio & walking Smoker:  No    Health Maintenance: Pap:  03-28-2018 neg HPV HR neg History of abnormal Pap:  no MMG:  04-06-2019 category d density birads 1:neg, scheduled for 05-21-20 Colonoscopy:  2014 f/u 63yr BMD:   07-24-2019 low bone mass TDaP:  2021 Gardasil:   n/a Covid-19: moderna Hep C testing: not done Screening Labs: Dr. KHumphrey Rollsin [place]  reports that she has never smoked. She has never used smokeless tobacco. She reports current alcohol use of about 14.0 standard drinks of alcohol per week. She reports that she does not use drugs.  Past Medical History:  Diagnosis Date  . Asthma     Past Surgical History:  Procedure Laterality Date  . CESAREAN SECTION  1993    Current Outpatient Medications  Medication Sig Dispense Refill  . albuterol (VENTOLIN HFA) 108 (90 Base) MCG/ACT inhaler Inhale 2 puffs into the lungs as needed for wheezing or shortness of breath.    .Marland KitchenBREO ELLIPTA 100-25 MCG/INH AEPB 1 puff daily.    . Calcium Carb-Cholecalciferol (CALCIUM 1000 + D PO) Take by mouth.    . rosuvastatin (CRESTOR) 5 MG tablet Take 5 mg by mouth daily.     No current facility-administered medications for  this visit.    Family History  Problem Relation Age of Onset  . Breast cancer Mother 651      after menopause  . Breast cancer Sister 432      inflammatory breast cancer; pre-menopausal  . Prostate cancer Brother 681 . Heart attack Maternal Uncle 40  . Breast cancer Cousin 453 . Hodgkin's lymphoma Cousin        dx in her teens and went on to develop breast cancer  . Bladder Cancer Cousin        non-smoker  . Cancer Brother        Liver    Review of Systems  Constitutional: Negative.   HENT: Negative.   Eyes: Negative.   Respiratory: Negative.   Cardiovascular: Negative.   Gastrointestinal: Negative.   Endocrine: Negative.   Genitourinary: Negative.   Musculoskeletal: Negative.   Skin: Negative.   Allergic/Immunologic: Negative.   Neurological: Negative.   Hematological: Negative.   Psychiatric/Behavioral: Negative.     Exam:   BP 114/74   Pulse 94   Resp 16   Ht 5' 4.75" (1.645 m)   Wt 131 lb (59.4 kg)   BMI 21.97 kg/m   Height: 5' 4.75" (164.5 cm)  General appearance: alert, cooperative and appears stated age, no acute distress Head: Normocephalic, without obvious abnormality Neck: no adenopathy, thyroid normal to inspection  and palpation Lungs: clear to auscultation bilaterally Breasts: No axillary or supraclavicular adenopathy, Right breast palpates normal, left breast with thick dense area in upper outer quadrant Heart: regular rate and rhythm Chest wall: pectus excavatum, mild Abdomen: soft, non-tender; no masses,  no organomegaly Extremities: extremities normal, no edema Skin: No rashes or lesions Lymph nodes: Cervical, supraclavicular, and axillary nodes normal. No abnormal inguinal nodes palpated Neurologic: Grossly normal   Pelvic: External genitalia:  no lesions              Urethra:  normal appearing urethra with no masses, tenderness or lesions              Bartholins and Skenes: normal                 Vagina: normal appearing vagina,  appropriate for age, normal appearing discharge, no lesions              Cervix: neg cervical motion tenderness, no visible lesions             Bimanual Exam:   Uterus:  normal size, contour, position, consistency, mobility, non-tender              Adnexa: no mass, fullness, tenderness               Rectal: no mass  A:  Well Woman with normal exam  Breast density Left upper outer quadrant   (mother and sister both with breast cancer)  P:   Pap : cotesting due 2025  Mammogram:Will change screening appointment to diagnostic mammogram   Labs:with PCP  Medications: no new  Dexa: osteopenia, -2.4. Next DEXA due 2023   Will follow up after diagnostic mammogram to determine high risk screening going forward.

## 2020-04-08 ENCOUNTER — Telehealth: Payer: Self-pay | Admitting: *Deleted

## 2020-04-08 ENCOUNTER — Encounter: Payer: Self-pay | Admitting: Nurse Practitioner

## 2020-04-08 ENCOUNTER — Other Ambulatory Visit: Payer: Self-pay

## 2020-04-08 ENCOUNTER — Ambulatory Visit (INDEPENDENT_AMBULATORY_CARE_PROVIDER_SITE_OTHER): Payer: BC Managed Care – PPO | Admitting: Nurse Practitioner

## 2020-04-08 VITALS — BP 114/74 | HR 94 | Resp 16 | Ht 64.75 in | Wt 131.0 lb

## 2020-04-08 DIAGNOSIS — Z01419 Encounter for gynecological examination (general) (routine) without abnormal findings: Secondary | ICD-10-CM | POA: Diagnosis not present

## 2020-04-08 DIAGNOSIS — R922 Inconclusive mammogram: Secondary | ICD-10-CM

## 2020-04-08 DIAGNOSIS — Z803 Family history of malignant neoplasm of breast: Secondary | ICD-10-CM

## 2020-04-08 NOTE — Telephone Encounter (Signed)
Patient scheduled on 05/12/20 @ 7:20am at the breast center patient informed with time and date, aware mammogram has been canceled.

## 2020-04-08 NOTE — Patient Instructions (Addendum)
1. Pectus excavatum. This is the term for the chest wall that sinks in a little. This may never cause you problems 2. UberLube is a lubricant that is silicone based and is very soothing 3. We will change your screening mammogram appointment to a diagnostic mammogram, you will get a call  Health Maintenance for Postmenopausal Women Menopause is a normal process in which your ability to get pregnant comes to an end. This process happens slowly over many months or years, usually between the ages of 47 and 73. Menopause is complete when you have missed your menstrual periods for 12 months. It is important to talk with your health care provider about some of the most common conditions that affect women after menopause (postmenopausal women). These include heart disease, cancer, and bone loss (osteoporosis). Adopting a healthy lifestyle and getting preventive care can help to promote your health and wellness. The actions you take can also lower your chances of developing some of these common conditions. What should I know about menopause? During menopause, you may get a number of symptoms, such as:  Hot flashes. These can be moderate or severe.  Night sweats.  Decrease in sex drive.  Mood swings.  Headaches.  Tiredness.  Irritability.  Memory problems.  Insomnia. Choosing to treat or not to treat these symptoms is a decision that you make with your health care provider. Do I need hormone replacement therapy?  Hormone replacement therapy is effective in treating symptoms that are caused by menopause, such as hot flashes and night sweats.  Hormone replacement carries certain risks, especially as you become older. If you are thinking about using estrogen or estrogen with progestin, discuss the benefits and risks with your health care provider. What is my risk for heart disease and stroke? The risk of heart disease, heart attack, and stroke increases as you age. One of the causes may be a  change in the body's hormones during menopause. This can affect how your body uses dietary fats, triglycerides, and cholesterol. Heart attack and stroke are medical emergencies. There are many things that you can do to help prevent heart disease and stroke. Watch your blood pressure  High blood pressure causes heart disease and increases the risk of stroke. This is more likely to develop in people who have high blood pressure readings, are of African descent, or are overweight.  Have your blood pressure checked: ? Every 3-5 years if you are 30-3 years of age. ? Every year if you are 67 years old or older. Eat a healthy diet  Eat a diet that includes plenty of vegetables, fruits, low-fat dairy products, and lean protein.  Do not eat a lot of foods that are high in solid fats, added sugars, or sodium.   Get regular exercise Get regular exercise. This is one of the most important things you can do for your health. Most adults should:  Try to exercise for at least 150 minutes each week. The exercise should increase your heart rate and make you sweat (moderate-intensity exercise).  Try to do strengthening exercises at least twice each week. Do these in addition to the moderate-intensity exercise.  Spend less time sitting. Even light physical activity can be beneficial. Other tips  Work with your health care provider to achieve or maintain a healthy weight.  Do not use any products that contain nicotine or tobacco, such as cigarettes, e-cigarettes, and chewing tobacco. If you need help quitting, ask your health care provider.  Know your numbers. Ask  your health care provider to check your cholesterol and your blood sugar (glucose). Continue to have your blood tested as directed by your health care provider. Do I need screening for cancer? Depending on your health history and family history, you may need to have cancer screening at different stages of your life. This may include screening  for:  Breast cancer.  Cervical cancer.  Lung cancer.  Colorectal cancer. What is my risk for osteoporosis? After menopause, you may be at increased risk for osteoporosis. Osteoporosis is a condition in which bone destruction happens more quickly than new bone creation. To help prevent osteoporosis or the bone fractures that can happen because of osteoporosis, you may take the following actions:  If you are 37-62 years old, get at least 1,000 mg of calcium and at least 600 mg of vitamin D per day.  If you are older than age 51 but younger than age 31, get at least 1,200 mg of calcium and at least 600 mg of vitamin D per day.  If you are older than age 60, get at least 1,200 mg of calcium and at least 800 mg of vitamin D per day. Smoking and drinking excessive alcohol increase the risk of osteoporosis. Eat foods that are rich in calcium and vitamin D, and do weight-bearing exercises several times each week as directed by your health care provider. How does menopause affect my mental health? Depression may occur at any age, but it is more common as you become older. Common symptoms of depression include:  Low or sad mood.  Changes in sleep patterns.  Changes in appetite or eating patterns.  Feeling an overall lack of motivation or enjoyment of activities that you previously enjoyed.  Frequent crying spells. Talk with your health care provider if you think that you are experiencing depression. General instructions See your health care provider for regular wellness exams and vaccines. This may include:  Scheduling regular health, dental, and eye exams.  Getting and maintaining your vaccines. These include: ? Influenza vaccine. Get this vaccine each year before the flu season begins. ? Pneumonia vaccine. ? Shingles vaccine. ? Tetanus, diphtheria, and pertussis (Tdap) booster vaccine. Your health care provider may also recommend other immunizations. Tell your health care provider  if you have ever been abused or do not feel safe at home. Summary  Menopause is a normal process in which your ability to get pregnant comes to an end.  This condition causes hot flashes, night sweats, decreased interest in sex, mood swings, headaches, or lack of sleep.  Treatment for this condition may include hormone replacement therapy.  Take actions to keep yourself healthy, including exercising regularly, eating a healthy diet, watching your weight, and checking your blood pressure and blood sugar levels.  Get screened for cancer and depression. Make sure that you are up to date with all your vaccines. This information is not intended to replace advice given to you by your health care provider. Make sure you discuss any questions you have with your health care provider. Document Revised: 01/04/2018 Document Reviewed: 01/04/2018 Elsevier Patient Education  2021 [employer].

## 2020-04-08 NOTE — Telephone Encounter (Signed)
-----   Message from Clarita Crane, NP sent at 04/08/2020 11:24 AM EDT ----- This patient has a screening mammogram scheduled for April 27 at the breast center. Can you change this appointment from a screening to a diagnostic?  She is high risk for breast cancer with mother and sister with hx breast cancer.  Today I palpated a Fullness/density with out definite borders in Left breast, upper outer quadrant.  Thanks, [employer]

## 2020-05-12 ENCOUNTER — Other Ambulatory Visit: Payer: BC Managed Care – PPO

## 2020-05-21 ENCOUNTER — Ambulatory Visit: Payer: BC Managed Care – PPO

## 2020-05-22 ENCOUNTER — Other Ambulatory Visit: Payer: BC Managed Care – PPO

## 2020-06-11 ENCOUNTER — Ambulatory Visit
Admission: RE | Admit: 2020-06-11 | Discharge: 2020-06-11 | Disposition: A | Discharge status: Home / Self Care | Payer: BC Managed Care – PPO | Source: Ambulatory Visit | Attending: Nurse Practitioner | Admitting: Nurse Practitioner

## 2020-06-11 ENCOUNTER — Other Ambulatory Visit: Payer: Self-pay

## 2020-06-11 DIAGNOSIS — R922 Inconclusive mammogram: Secondary | ICD-10-CM

## 2020-06-11 DIAGNOSIS — Z803 Family history of malignant neoplasm of breast: Secondary | ICD-10-CM

## 2020-06-11 DIAGNOSIS — N6489 Other specified disorders of breast: Secondary | ICD-10-CM | POA: Diagnosis not present

## 2020-10-13 DIAGNOSIS — Z1331 Encounter for screening for depression: Secondary | ICD-10-CM | POA: Diagnosis not present

## 2020-10-13 DIAGNOSIS — Z13220 Encounter for screening for lipoid disorders: Secondary | ICD-10-CM | POA: Diagnosis not present

## 2020-10-13 DIAGNOSIS — Z6822 Body mass index (BMI) 22.0-22.9, adult: Secondary | ICD-10-CM | POA: Diagnosis not present

## 2020-10-13 DIAGNOSIS — I712 Thoracic aortic aneurysm, without rupture: Secondary | ICD-10-CM | POA: Diagnosis not present

## 2020-10-13 DIAGNOSIS — Z0000 Encounter for general adult medical examination without abnormal findings: Secondary | ICD-10-CM | POA: Diagnosis not present

## 2020-10-13 DIAGNOSIS — Z23 Encounter for immunization: Secondary | ICD-10-CM | POA: Diagnosis not present

## 2021-04-15 NOTE — Progress Notes (Signed)
? ?  Kimberly Schwartz 01-Jun-1960 [id number] ? ? ?History: Postmenopausal 61 y.o. presents for annual exam.Strong family history of breast cancer. Saw genetic counselor [phone]. Lifetime risk of breast cancer Tyrer Cusik 21.3 %,Gail 39.3 %. At that time pt declined BRCA testing. Last breast MRI 2017. ? ?Gynecologic History ?Postmenopausal ?Last Pap: 03/28/18. Results were: normal ?Last mammogram: 06/11/20, diagnostic Results were: normal ?Last colonoscopy: 2014, repeat 10 years ?DEXA: 6/29/21osteopenia, improved from osteoporosis with exercise and supplementation ?HRT use: no ? ?Obstetric History ?OB History  ?Gravida Para Term Preterm AB Living  ?$Remove'1 1       1  'HaRiNrb$ ?SAB IAB Ectopic Multiple Live Births  ?           ?  ?# Outcome Date GA Lbr Len/2nd Weight Sex Delivery Anes PTL Lv  ?1 Para           ? ? ? ?The following portions of the patient's history were reviewed and updated as appropriate: allergies, current medications, past family history, past medical history, past social history, past surgical history, and problem list. ? ?Review of Systems ?Pertinent items noted in HPI and remainder of comprehensive ROS otherwise negative.  ?Past medical history, past surgical history, family history and social history were all reviewed and documented in the EPIC chart. ? ?Exam: ? ?Vitals:  ? 04/16/21 1029  ?BP: 104/78  ?Weight: 135 lb (61.2 kg)  ?Height: 5' 4.25" (1.632 m)  ? ?Body mass index is 22.99 kg/m?. ? ?General appearance:  Normal ?Thyroid:  Symmetrical, normal in size, without palpable masses or nodularity. ?Respiratory ? Auscultation:  Clear without wheezing or rhonchi ?Cardiovascular ? Auscultation:  Regular rate, without rubs, murmurs or gallops ? Edema/varicosities:  Not grossly evident ?Abdominal ? Soft,nontender, without masses, guarding or rebound. ? Liver/spleen:  No organomegaly noted ? Hernia:  None appreciated ? Skin ? Inspection:  Grossly normal ?Breasts: Examined lying and sitting.  ? Right: Without masses,  retractions, nipple discharge or axillary adenopathy. ? ? Left: Without masses, retractions, nipple discharge or axillary adenopathy. ?Genitourinary  ? Inguinal/mons:  Normal without inguinal adenopathy ? External genitalia:  Normal appearing vulva with no masses, tenderness, or lesions ? BUS/Urethra/Skene's glands:  Normal ? Vagina:  Normal appearing with normal color and discharge, no lesions. Atrophy: mild  ? Cervix:  Normal appearing without discharge or lesions, pap obtained ? Uterus:  Normal in size, shape and contour.  Midline and mobile, nontender ? Adnexa/parametria:   ?  Rt: Normal in size, without masses or tenderness. ?  Lt: Normal in size, without masses or tenderness. ? Anus and perineum: Normal ?  ? ?Patient informed chaperone available to be present for breast and pelvic exam. Patient has requested no chaperone to be present. Patient has been advised what will be completed during breast and pelvic exam.  ? ?Assessment/Plan:   ? ?1. Well woman exam with routine gynecological exam ? ?- Cytology - PAP( [place]) ? ?2. History of osteoporosis ?Associated height loss ?DEXA after 07/23/21 ?Continue Calcium, vit d and weight bearing exercise ?- DG Bone Density; Future ? ?3. Family history of breast cancer in first degree relative ?Mammo due 5/23 ?Overdue for breast MRI, will consider once she meets her deductible ?  ?Discussed SBE, colonoscopy and DEXA screening as directed. Recommend 159mins of exercise weekly, including weight bearing exercise. Encouraged the use of seatbelts and sunscreen.  ?Return in 1 year for annual or sooner prn. ? ?Kerry Dory WHNP-BC, 10:57 AM 04/16/2021  ?

## 2021-04-16 ENCOUNTER — Other Ambulatory Visit (HOSPITAL_COMMUNITY)
Admission: RE | Admit: 2021-04-16 | Discharge: 2021-04-16 | Disposition: A | Discharge status: Home / Self Care | Payer: BC Managed Care – PPO | Source: Ambulatory Visit | Attending: Radiology | Admitting: Radiology

## 2021-04-16 ENCOUNTER — Other Ambulatory Visit: Payer: Self-pay

## 2021-04-16 ENCOUNTER — Encounter: Payer: Self-pay | Admitting: Radiology

## 2021-04-16 ENCOUNTER — Ambulatory Visit (INDEPENDENT_AMBULATORY_CARE_PROVIDER_SITE_OTHER): Payer: BC Managed Care – PPO | Admitting: Radiology

## 2021-04-16 VITALS — BP 104/78 | Ht 64.25 in | Wt 135.0 lb

## 2021-04-16 DIAGNOSIS — Z8739 Personal history of other diseases of the musculoskeletal system and connective tissue: Secondary | ICD-10-CM

## 2021-04-16 DIAGNOSIS — Z01419 Encounter for gynecological examination (general) (routine) without abnormal findings: Secondary | ICD-10-CM | POA: Insufficient documentation

## 2021-04-16 DIAGNOSIS — Z803 Family history of malignant neoplasm of breast: Secondary | ICD-10-CM | POA: Diagnosis not present

## 2021-04-21 LAB — CYTOLOGY - PAP
Comment: NEGATIVE
Diagnosis: NEGATIVE
High risk HPV: NEGATIVE

## 2021-04-27 ENCOUNTER — Other Ambulatory Visit: Payer: Self-pay | Admitting: Radiology

## 2021-04-27 DIAGNOSIS — Z1231 Encounter for screening mammogram for malignant neoplasm of breast: Secondary | ICD-10-CM

## 2021-05-13 DIAGNOSIS — H02831 Dermatochalasis of right upper eyelid: Secondary | ICD-10-CM | POA: Diagnosis not present

## 2021-06-10 DIAGNOSIS — S93402A Sprain of unspecified ligament of left ankle, initial encounter: Secondary | ICD-10-CM | POA: Diagnosis not present

## 2021-06-12 ENCOUNTER — Ambulatory Visit
Admission: RE | Admit: 2021-06-12 | Discharge: 2021-06-12 | Disposition: A | Discharge status: Home / Self Care | Payer: BC Managed Care – PPO | Source: Ambulatory Visit | Attending: Radiology | Admitting: Radiology

## 2021-06-12 DIAGNOSIS — Z1231 Encounter for screening mammogram for malignant neoplasm of breast: Secondary | ICD-10-CM | POA: Diagnosis not present

## 2021-07-01 ENCOUNTER — Other Ambulatory Visit: Payer: Self-pay | Admitting: Surgery

## 2021-07-01 DIAGNOSIS — I712 Thoracic aortic aneurysm, without rupture, unspecified: Secondary | ICD-10-CM

## 2021-08-18 ENCOUNTER — Other Ambulatory Visit: Payer: Self-pay | Admitting: Radiology

## 2021-08-18 ENCOUNTER — Ambulatory Visit (INDEPENDENT_AMBULATORY_CARE_PROVIDER_SITE_OTHER): Payer: BC Managed Care – PPO

## 2021-08-18 DIAGNOSIS — Z780 Asymptomatic menopausal state: Secondary | ICD-10-CM

## 2021-08-18 DIAGNOSIS — Z13820 Encounter for screening for osteoporosis: Secondary | ICD-10-CM

## 2021-08-18 DIAGNOSIS — Z8739 Personal history of other diseases of the musculoskeletal system and connective tissue: Secondary | ICD-10-CM

## 2021-08-18 DIAGNOSIS — M810 Age-related osteoporosis without current pathological fracture: Secondary | ICD-10-CM

## 2021-08-26 ENCOUNTER — Ambulatory Visit: Payer: BC Managed Care – PPO | Admitting: Surgery

## 2021-08-26 ENCOUNTER — Other Ambulatory Visit: Payer: BC Managed Care – PPO

## 2021-08-31 ENCOUNTER — Ambulatory Visit (INDEPENDENT_AMBULATORY_CARE_PROVIDER_SITE_OTHER): Payer: BC Managed Care – PPO | Admitting: Radiology

## 2021-08-31 VITALS — BP 120/78

## 2021-08-31 DIAGNOSIS — M810 Age-related osteoporosis without current pathological fracture: Secondary | ICD-10-CM | POA: Diagnosis not present

## 2021-08-31 NOTE — Progress Notes (Signed)
   Kimberly Schwartz March 31, 1960 [id number]   History: Postmenopausal 61 y.o. presents for consult after DEXA showed worsening osteoporosis.   Gynecologic History Postmenopausal Last Pap: 04/16/21. Results were: normal Last mammogram: 06/12/21. Results were: normal HRT use: never  Obstetric History OB History  Gravida Para Term Preterm AB Living  1 1       1   SAB IAB Ectopic Multiple Live Births               # Outcome Date GA Lbr Len/2nd Weight Sex Delivery Anes PTL Lv  1 Para              The following portions of the patient's history were reviewed and updated as appropriate: allergies, current medications, past family history, past medical history, past social history, past surgical history, and problem list.  Review of Systems Pertinent items noted in HPI and remainder of comprehensive ROS otherwise negative.  Past medical history, past surgical history, family history and social history were all reviewed and documented in the EPIC chart.  Exam:  Vitals:   08/31/21 1026  BP: 120/78   There is no height or weight on file to calculate BMI.  General appearance:  Normal  Results of DEXA:  Spine: Z score 0.5 (normal) with a 4.9% change from baseline Left femoral neck:  -2.1 Z score (osteopenia) with a 3% change from baseline Right femoral neck: Z score -2.6 (osteoporosis) with a 0.3% change from baseline    Assessment/Plan:   1. Age-related osteoporosis without current pathological fracture  - Thyroid Panel With TSH - PTH, Intact and Calcium - Vitamin D (25 hydroxy)   Discussed treatment options including Fosamax, boniva or starting with Evenity to build bone and switching to Prolia. Will consider and let me know how she would like to proceed.  10/31/21 B WHNP-BC, 10:51 AM 08/31/2021

## 2021-09-01 LAB — VITAMIN D 25 HYDROXY (VIT D DEFICIENCY, FRACTURES): Vit D, 25-Hydroxy: 47 ng/mL (ref 30–100)

## 2021-09-01 LAB — THYROID PANEL WITH TSH
Free Thyroxine Index: 2.1 (ref 1.4–3.8)
T3 Uptake: 33 % (ref 22–35)
T4, Total: 6.3 ug/dL (ref 5.1–11.9)
TSH: 4.07 mIU/L (ref 0.40–4.50)

## 2021-09-02 ENCOUNTER — Ambulatory Visit
Admission: RE | Admit: 2021-09-02 | Discharge: 2021-09-02 | Disposition: A | Discharge status: Home / Self Care | Payer: BC Managed Care – PPO | Source: Ambulatory Visit | Attending: Surgery | Admitting: Surgery

## 2021-09-02 DIAGNOSIS — I712 Thoracic aortic aneurysm, without rupture, unspecified: Secondary | ICD-10-CM

## 2021-09-02 LAB — PTH, INTACT AND CALCIUM
Calcium: 9.5 mg/dL (ref 8.6–10.4)
PTH: 39 pg/mL (ref 16–77)

## 2021-09-02 MED ORDER — IOPAMIDOL (ISOVUE-370) INJECTION 76%
75.0000 mL | Freq: Once | INTRAVENOUS | Status: AC | PRN
  Administered 2021-09-02: 75 mL via INTRAVENOUS

## 2021-09-04 DIAGNOSIS — L570 Actinic keratosis: Secondary | ICD-10-CM | POA: Diagnosis not present

## 2021-09-04 DIAGNOSIS — L814 Other melanin hyperpigmentation: Secondary | ICD-10-CM | POA: Diagnosis not present

## 2021-09-04 DIAGNOSIS — L821 Other seborrheic keratosis: Secondary | ICD-10-CM | POA: Diagnosis not present

## 2021-09-04 DIAGNOSIS — L578 Other skin changes due to chronic exposure to nonionizing radiation: Secondary | ICD-10-CM | POA: Diagnosis not present

## 2021-09-04 DIAGNOSIS — D225 Melanocytic nevi of trunk: Secondary | ICD-10-CM | POA: Diagnosis not present

## 2021-09-09 ENCOUNTER — Encounter: Payer: Self-pay | Admitting: Surgery

## 2021-09-09 ENCOUNTER — Ambulatory Visit (INDEPENDENT_AMBULATORY_CARE_PROVIDER_SITE_OTHER): Payer: BC Managed Care – PPO | Admitting: Surgery

## 2021-09-09 VITALS — BP 113/71 | HR 93 | Resp 18 | Ht 64.0 in | Wt 137.0 lb

## 2021-09-09 DIAGNOSIS — I712 Thoracic aortic aneurysm, without rupture, unspecified: Secondary | ICD-10-CM | POA: Diagnosis not present

## 2021-09-13 NOTE — Progress Notes (Signed)
HPI:  The patient is a 61 year old woman who returns for follow-up of a 4.0 cm fusiform ascending aortic aneurysm.  I last saw her on 08/01/2019 at which time CT scan showed the aneurysm to be 4.0 cm and stable from 1 year prior. Her descending aorta at the same level measures 2.0 cm.  She continues to remain active without any complaints.  Current Outpatient Medications  Medication Sig Dispense Refill   albuterol (VENTOLIN HFA) 108 (90 Base) MCG/ACT inhaler Inhale 2 puffs into the lungs as needed for wheezing or shortness of breath.     BREO ELLIPTA 100-25 MCG/INH AEPB 1 puff daily.     Calcium Carb-Cholecalciferol (CALCIUM 1000 + D PO) Take by mouth.     rosuvastatin (CRESTOR) 5 MG tablet Take 5 mg by mouth daily.     No current facility-administered medications for this visit.     Physical Exam: BP 113/71 (BP Location: Right Arm, Patient Position: Sitting)   Pulse 93   Resp 18   Ht 5\' 4"  (1.626 m)   Wt 137 lb (62.1 kg)   SpO2 98% Comment: RA  BMI 23.52 kg/m  She looks well Cardiac exam shows a regular rate and rhythm with normal heart sounds. There is no murmur. Lungs are clear.  Diagnostic Tests:  Narrative & Impression  CLINICAL DATA:  Follow-up of thoracic ascending aortic aneurysm.   EXAM: CT ANGIOGRAPHY CHEST WITH CONTRAST   TECHNIQUE: Multidetector CT imaging of the chest was performed using the standard protocol during bolus administration of intravenous contrast. Multiplanar CT image reconstructions and MIPs were obtained to evaluate the vascular anatomy.   RADIATION DOSE REDUCTION: This exam was performed according to the departmental dose-optimization program which includes automated exposure control, adjustment of the mA and/or kV according to patient size and/or use of iterative reconstruction technique.   CONTRAST:  88mL ISOVUE-370 IOPAMIDOL (ISOVUE-370) INJECTION 76%   COMPARISON:  August 01, 2019   FINDINGS: Cardiovascular: Preferential  opacification of the thoracic aorta. Stable fusiform aneurysmal dilation of the ascending aorta measuring 4 cm in greatest dimension. Normal heart size. No pericardial effusion.   Mediastinum/Nodes: No enlarged mediastinal, hilar, or axillary lymph nodes. Thyroid gland, trachea, and esophagus demonstrate no significant findings.   Lungs/Pleura: Scarring versus atelectasis in the right lung base. The lungs otherwise clear.   Upper Abdomen: No acute abnormality.   Musculoskeletal: No chest wall abnormality. No acute or significant osseous findings.   Review of the MIP images confirms the above findings.   IMPRESSION: Stable fusiform aneurysmal dilation of the ascending aorta measuring 4 cm in greatest dimension. Recommend annual imaging followup by CTA or MRA. This recommendation follows 2010 ACCF/AHA/AATS/ACR/ASA/SCA/SCAI/SIR/STS/SVM Guidelines for the Diagnosis and Management of Patients with Thoracic Aortic Disease. Circulation. 2010; 1212011. Aortic aneurysm NOS (ICD10-I71.9)     Electronically Signed   By: : [id number] M.D.   On: 09/02/2021 12:37      Impression:  She has a stable 4.0 cm fusiform ascending aortic aneurysm which has been stable dating back to 2018 when it was first identified incidentally.  Her descending aorta at the same level is 2.0 cm.  The ascending aorta is well below the surgical threshold which is usually 5.5 cm but given the small size of her descending aorta I would probably use 5.0 cm in her case.  I reviewed the CT images with her and answered her questions.  I stressed the importance of continued good blood pressure control in preventing further enlargement  and acute aortic dissection.  I advised her against doing any heavy lifting that might require a Valsalva maneuver and could suddenly raise her blood pressure to high levels.  Plan:  I will plan to see her back in 2 years with a CTA of the chest.  I spent 20 minutes performing  this established patient evaluation and > 50% of this time was spent face to face counseling and coordinating the care of this patient's aortic aneurysm.    Alleen Borne, MD [facility] [phone]

## 2021-12-09 ENCOUNTER — Ambulatory Visit (INDEPENDENT_AMBULATORY_CARE_PROVIDER_SITE_OTHER): Payer: BC Managed Care – PPO | Admitting: Radiology

## 2021-12-09 VITALS — BP 112/76

## 2021-12-09 DIAGNOSIS — M816 Localized osteoporosis [Lequesne]: Secondary | ICD-10-CM | POA: Diagnosis not present

## 2021-12-09 MED ORDER — ALENDRONATE SODIUM 70 MG PO TABS: 70.0000 mg | ORAL_TABLET | ORAL | 3 refills | Status: DC

## 2021-12-09 NOTE — Progress Notes (Signed)
   Kimberly Schwartz [birth year] [id number]   History: Postmenopausal 61 y.o. presents for consult after DEXA showed worsening osteoporosis.   Gynecologic History Postmenopausal Last Pap: 04/16/21. Results were: normal Last mammogram: 06/12/21. Results were: normal HRT use: never  Obstetric History OB History  Gravida Para Term Preterm AB Living  1 1       1   SAB IAB Ectopic Multiple Live Births               # Outcome Date GA Lbr Len/2nd Weight Sex Delivery Anes PTL Lv  1 Para              The following portions of the patient's history were reviewed and updated as appropriate: allergies, current medications, past family history, past medical history, past social history, past surgical history, and problem list.  Review of Systems Pertinent items noted in HPI and remainder of comprehensive ROS otherwise negative.  Past medical history, past surgical history, family history and social history were all reviewed and documented in the EPIC chart.  Exam:  Vitals:   08/31/21 1026  BP: 120/78   There is no height or weight on file to calculate BMI.  General appearance:  Normal  Results of DEXA:  Spine: Z score 0.5 (normal) with a 4.9% change from baseline Left femoral neck:  -2.1 Z score (osteopenia) with a 3% change from baseline Right femoral neck: Z score -2.6 (osteoporosis) with a 0.3% change from baseline    Assessment/Plan:   1. Localized osteoporosis without current pathological fracture Desires fosamax as first line treatment, rx sent.  - alendronate (FOSAMAX) 70 MG tablet; Take 1 tablet (70 mg total) by mouth once a week. Take with a full glass of water on an empty stomach.  Dispense: 4 tablet; Refill: 3   Londin Antone B WHNP-BC, 10:51 AM 08/31/2021

## 2022-02-10 DIAGNOSIS — H02822 Cysts of right lower eyelid: Secondary | ICD-10-CM | POA: Diagnosis not present

## 2022-02-21 ENCOUNTER — Other Ambulatory Visit: Payer: Self-pay | Admitting: Radiology

## 2022-02-21 DIAGNOSIS — M816 Localized osteoporosis [Lequesne]: Secondary | ICD-10-CM

## 2022-02-25 NOTE — Telephone Encounter (Signed)
Last AEX 04/16/21.  RF received for Fosamax 70mg .

## 2022-04-08 DIAGNOSIS — M810 Age-related osteoporosis without current pathological fracture: Secondary | ICD-10-CM | POA: Diagnosis not present

## 2022-04-08 DIAGNOSIS — Z0000 Encounter for general adult medical examination without abnormal findings: Secondary | ICD-10-CM | POA: Diagnosis not present

## 2022-04-08 DIAGNOSIS — E7800 Pure hypercholesterolemia, unspecified: Secondary | ICD-10-CM | POA: Diagnosis not present

## 2022-04-08 DIAGNOSIS — J45909 Unspecified asthma, uncomplicated: Secondary | ICD-10-CM | POA: Diagnosis not present

## 2022-04-28 ENCOUNTER — Ambulatory Visit (INDEPENDENT_AMBULATORY_CARE_PROVIDER_SITE_OTHER): Payer: BC Managed Care – PPO | Admitting: Radiology

## 2022-04-28 ENCOUNTER — Encounter: Payer: Self-pay | Admitting: Radiology

## 2022-04-28 VITALS — BP 104/72 | Ht 64.5 in | Wt 134.0 lb

## 2022-04-28 DIAGNOSIS — Z01419 Encounter for gynecological examination (general) (routine) without abnormal findings: Secondary | ICD-10-CM

## 2022-04-28 DIAGNOSIS — M810 Age-related osteoporosis without current pathological fracture: Secondary | ICD-10-CM

## 2022-04-28 NOTE — Progress Notes (Signed)
   Rupali Gonya 08/21/1960 [id number]   History: Postmenopausal 62 y.o. presents for annual exam. Doing well, no new gyn concerns.   Gynecologic History Postmenopausal Last Pap: 2023. Results were: normal Last mammogram: 06/12/21. Results were: normal Last colonoscopy: 2014 DEXA: osteoporosis, on fosamax   Obstetric History OB History  Gravida Para Term Preterm AB Living  1 1       1   SAB IAB Ectopic Multiple Live Births               # Outcome Date GA Lbr Len/2nd Weight Sex Delivery Anes PTL Lv  1 Para              The following portions of the patient's history were reviewed and updated as appropriate: allergies, current medications, past family history, past medical history, past social history, past surgical history, and problem list.  Review of Systems Pertinent items noted in HPI and remainder of comprehensive ROS otherwise negative.  Past medical history, past surgical history, family history and social history were all reviewed and documented in the EPIC chart.  Exam:  Vitals:   04/28/22 0901  BP: 104/72  Weight: 134 lb (60.8 kg)  Height: 5' 4.5" (1.638 m)   Body mass index is 22.65 kg/m.  General appearance:  Normal Thyroid:  Symmetrical, normal in size, without palpable masses or nodularity. Respiratory  Auscultation:  Clear without wheezing or rhonchi Cardiovascular  Auscultation:  Regular rate, without rubs, murmurs or gallops  Edema/varicosities:  Not grossly evident Abdominal  Soft,nontender, without masses, guarding or rebound.  Liver/spleen:  No organomegaly noted  Hernia:  None appreciated  Skin  Inspection:  Grossly normal Breasts: Examined lying and sitting.   Right: Without masses, retractions, nipple discharge or axillary adenopathy.   Left: Without masses, retractions, nipple discharge or axillary adenopathy. Genitourinary   Inguinal/mons:  Normal without inguinal adenopathy  External genitalia:  Normal appearing vulva with no  masses, tenderness, or lesions  BUS/Urethra/Skene's glands:  Normal  Vagina:  Normal appearing with normal color and discharge, no lesions. Atrophy: mild   Cervix:  Normal appearing without discharge or lesions  Uterus:  Normal in size, shape and contour.  Midline and mobile, nontender  Adnexa/parametria:     Rt: Normal in size, without masses or tenderness.   Lt: Normal in size, without masses or tenderness.  Anus and perineum: Normal    Patient informed chaperone available to be present for breast and pelvic exam. Patient has requested no chaperone to be present. Patient has been advised what will be completed during breast and pelvic exam.   Assessment/Plan:   1. Well woman exam with routine gynecological exam Pap due 2028  2. Age-related osteoporosis without current pathological fracture Continue fosamax, calcium and vit D    Discussed SBE, colonoscopy and DEXA screening as directed. Recommend 117mins of exercise weekly, including weight bearing exercise. Encouraged the use of seatbelts and sunscreen.  Return in 1 year for annual or sooner prn.  Rubbie Battiest B WHNP-BC, 9:35 AM 04/28/2022

## 2022-05-10 ENCOUNTER — Other Ambulatory Visit: Payer: Self-pay | Admitting: Family Medicine

## 2022-05-10 DIAGNOSIS — Z1231 Encounter for screening mammogram for malignant neoplasm of breast: Secondary | ICD-10-CM

## 2022-05-25 ENCOUNTER — Other Ambulatory Visit: Payer: Self-pay

## 2022-05-25 DIAGNOSIS — M816 Localized osteoporosis [Lequesne]: Secondary | ICD-10-CM

## 2022-05-25 MED ORDER — ALENDRONATE SODIUM 70 MG PO TABS: 70.0000 mg | ORAL_TABLET | ORAL | 3 refills | Status: DC

## 2022-05-25 NOTE — Telephone Encounter (Signed)
From: Gaynelle Adu To: Office of [place], [place] B, NP Sent: 05/25/2022 4:49 PM EDT Subject: Medication Renewal Request  Refills have been requested for the following medications:   alendronate (FOSAMAX) 70 MG tablet Kimberly Solomons B]  Patient Comment: Please change pharmacy [id number] [address] [place], [place] [address] [phone]  Preferred pharmacy: [id number] [place], [place] - [address] AT [address] Delivery method: Daryll Drown

## 2022-05-25 NOTE — Telephone Encounter (Signed)
JC pt. Last AEX 04/28/2022--recall placed for 2025.   Last DEXA 07/2021--recall placed for 07/2023.

## 2022-06-15 ENCOUNTER — Ambulatory Visit
Admission: RE | Admit: 2022-06-15 | Discharge: 2022-06-15 | Disposition: A | Discharge status: Home / Self Care | Payer: BC Managed Care – PPO | Source: Ambulatory Visit | Attending: Family Medicine | Admitting: Family Medicine

## 2022-06-15 DIAGNOSIS — Z1231 Encounter for screening mammogram for malignant neoplasm of breast: Secondary | ICD-10-CM

## 2022-06-16 ENCOUNTER — Ambulatory Visit: Payer: BC Managed Care – PPO

## 2022-06-18 ENCOUNTER — Other Ambulatory Visit: Payer: Self-pay | Admitting: Family Medicine

## 2022-06-18 DIAGNOSIS — R928 Other abnormal and inconclusive findings on diagnostic imaging of breast: Secondary | ICD-10-CM

## 2022-06-29 DIAGNOSIS — J069 Acute upper respiratory infection, unspecified: Secondary | ICD-10-CM | POA: Diagnosis not present

## 2022-06-30 ENCOUNTER — Ambulatory Visit
Admission: RE | Admit: 2022-06-30 | Discharge: 2022-06-30 | Disposition: A | Discharge status: Home / Self Care | Payer: BC Managed Care – PPO | Source: Ambulatory Visit | Attending: Family Medicine | Admitting: Family Medicine

## 2022-06-30 DIAGNOSIS — R928 Other abnormal and inconclusive findings on diagnostic imaging of breast: Secondary | ICD-10-CM

## 2022-06-30 DIAGNOSIS — R922 Inconclusive mammogram: Secondary | ICD-10-CM | POA: Diagnosis not present

## 2022-06-30 DIAGNOSIS — R92342 Mammographic extreme density, left breast: Secondary | ICD-10-CM | POA: Diagnosis not present

## 2022-08-27 DIAGNOSIS — D225 Melanocytic nevi of trunk: Secondary | ICD-10-CM | POA: Diagnosis not present

## 2022-08-27 DIAGNOSIS — L821 Other seborrheic keratosis: Secondary | ICD-10-CM | POA: Diagnosis not present

## 2022-08-27 DIAGNOSIS — D485 Neoplasm of uncertain behavior of skin: Secondary | ICD-10-CM | POA: Diagnosis not present

## 2022-08-27 DIAGNOSIS — L578 Other skin changes due to chronic exposure to nonionizing radiation: Secondary | ICD-10-CM | POA: Diagnosis not present

## 2022-08-27 DIAGNOSIS — L570 Actinic keratosis: Secondary | ICD-10-CM | POA: Diagnosis not present

## 2022-08-27 DIAGNOSIS — L814 Other melanin hyperpigmentation: Secondary | ICD-10-CM | POA: Diagnosis not present

## 2022-08-27 DIAGNOSIS — C44519 Basal cell carcinoma of skin of other part of trunk: Secondary | ICD-10-CM | POA: Diagnosis not present

## 2022-08-27 DIAGNOSIS — D235 Other benign neoplasm of skin of trunk: Secondary | ICD-10-CM | POA: Diagnosis not present

## 2022-09-28 ENCOUNTER — Other Ambulatory Visit: Payer: Self-pay | Admitting: Medical Genetics

## 2022-09-28 DIAGNOSIS — Z006 Encounter for examination for normal comparison and control in clinical research program: Secondary | ICD-10-CM

## 2022-10-11 DIAGNOSIS — C44319 Basal cell carcinoma of skin of other parts of face: Secondary | ICD-10-CM | POA: Diagnosis not present

## 2022-11-03 ENCOUNTER — Encounter: Payer: Self-pay | Admitting: Gastroenterology

## 2022-11-11 ENCOUNTER — Encounter: Payer: Self-pay | Admitting: Gastroenterology

## 2022-12-09 ENCOUNTER — Encounter: Payer: Self-pay | Admitting: Gastroenterology

## 2023-01-24 ENCOUNTER — Encounter: Payer: BC Managed Care – PPO | Admitting: Gastroenterology

## 2023-01-31 ENCOUNTER — Ambulatory Visit (AMBULATORY_SURGERY_CENTER): Payer: BC Managed Care – PPO

## 2023-01-31 VITALS — Ht 64.5 in | Wt 133.0 lb

## 2023-01-31 DIAGNOSIS — Z1211 Encounter for screening for malignant neoplasm of colon: Secondary | ICD-10-CM

## 2023-01-31 MED ORDER — NA SULFATE-K SULFATE-MG SULF 17.5-3.13-1.6 GM/177ML PO SOLN: 1.0000 | Freq: Once | ORAL | 0 refills | Status: AC

## 2023-01-31 NOTE — Progress Notes (Signed)

## 2023-02-09 ENCOUNTER — Encounter: Payer: Self-pay | Admitting: Gastroenterology

## 2023-02-11 ENCOUNTER — Ambulatory Visit (AMBULATORY_SURGERY_CENTER): Payer: BC Managed Care – PPO | Admitting: Gastroenterology

## 2023-02-11 ENCOUNTER — Encounter: Payer: Self-pay | Admitting: Gastroenterology

## 2023-02-11 VITALS — BP 84/57 | HR 75 | Temp 97.7°F | Resp 17 | Ht 64.5 in | Wt 133.0 lb

## 2023-02-11 DIAGNOSIS — K640 First degree hemorrhoids: Secondary | ICD-10-CM | POA: Diagnosis not present

## 2023-02-11 DIAGNOSIS — K5730 Diverticulosis of large intestine without perforation or abscess without bleeding: Secondary | ICD-10-CM | POA: Diagnosis not present

## 2023-02-11 DIAGNOSIS — D122 Benign neoplasm of ascending colon: Secondary | ICD-10-CM

## 2023-02-11 DIAGNOSIS — Z1211 Encounter for screening for malignant neoplasm of colon: Secondary | ICD-10-CM | POA: Diagnosis not present

## 2023-02-11 DIAGNOSIS — D120 Benign neoplasm of cecum: Secondary | ICD-10-CM | POA: Diagnosis not present

## 2023-02-11 MED ORDER — SODIUM CHLORIDE 0.9 % IV SOLN: 500.0000 mL | Freq: Once | INTRAVENOUS | Status: DC

## 2023-02-11 NOTE — Patient Instructions (Addendum)
-   Resume previous diet. - Continue present medications. - Await pathology results. - Repeat colonoscopy for surveillance based on  pathology results.  YOU HAD AN ENDOSCOPIC PROCEDURE TODAY AT THE [facility]:   Refer to the procedure report that was given to you for any specific questions about what was found during the examination.  If the procedure report does not answer your questions, please call your gastroenterologist to clarify.  If you requested that your care partner not be given the details of your procedure findings, then the procedure report has been included in a sealed envelope for you to review at your convenience later.  YOU SHOULD EXPECT: Some feelings of bloating in the abdomen. Passage of more gas than usual.  Walking can help get rid of the air that was put into your GI tract during the procedure and reduce the bloating. If you had a lower endoscopy (such as a colonoscopy or flexible sigmoidoscopy) you may notice spotting of blood in your stool or on the toilet paper. If you underwent a bowel prep for your procedure, you may not have a normal bowel movement for a few days.  Please Note:  You might notice some irritation and congestion in your nose or some drainage.  This is from the oxygen used during your procedure.  There is no need for concern and it should clear up in a day or so.  SYMPTOMS TO REPORT IMMEDIATELY:  Following lower endoscopy (colonoscopy or flexible sigmoidoscopy):  Excessive amounts of blood in the stool  Significant tenderness or worsening of abdominal pains  Swelling of the abdomen that is new, acute  Fever of 100F or higher   For urgent or emergent issues, a gastroenterologist can be reached at any hour by calling [phone]. Do not use MyChart messaging for urgent concerns.    DIET:  We do recommend a small meal at first, but then you may proceed to your regular diet.  Drink plenty of fluids but you should avoid alcoholic  beverages for 24 hours.  ACTIVITY:  You should plan to take it easy for the rest of today and you should NOT DRIVE or use heavy machinery until tomorrow (because of the sedation medicines used during the test).    FOLLOW UP: Our staff will call the number listed on your records the next business day following your procedure.  We will call around 7:15- 8:00 am to check on you and address any questions or concerns that you may have regarding the information given to you following your procedure. If we do not reach you, we will leave a message.     If any biopsies were taken you will be contacted by phone or by letter within the next 1-3 weeks.  Please call us at [phone] if you have not heard about the biopsies in 3 weeks.    SIGNATURES/CONFIDENTIALITY: You and/or your care partner have signed paperwork which will be entered into your electronic medical record.  These signatures attest to the fact that that the information above on your After Visit Summary has been reviewed and is understood.  Full responsibility of the confidentiality of this discharge information lies with you and/or your care-partner. 

## 2023-02-11 NOTE — Op Note (Signed)
[facility] Patient Name: Kimberly Schwartz Procedure Date: 02/11/2023 1:18 PM MRN: [id number] Endoscopist: Lynann Bologna , MD, [id number] Age: 63 Referring MD:  Date of Birth: 25-May-1960 Gender: Female Account #: [id number] Procedure:                Colonoscopy Indications:              Screening for colorectal malignant neoplasm Medicines:                Monitored Anesthesia Care Procedure:                Pre-Anesthesia Assessment:                           - Prior to the procedure, a History and Physical                            was performed, and patient medications and                            allergies were reviewed. The patient's tolerance of                            previous anesthesia was also reviewed. The risks                            and benefits of the procedure and the sedation                            options and risks were discussed with the patient.                            All questions were answered, and informed consent                            was obtained. Prior Anticoagulants: The patient has                            taken no anticoagulant or antiplatelet agents. ASA                            Grade Assessment: I - A normal, healthy patient.                            After reviewing the risks and benefits, the patient                            was deemed in satisfactory condition to undergo the                            procedure.                           After obtaining informed consent, the colonoscope  was passed under direct vision. Throughout the                            procedure, the patient's blood pressure, pulse, and                            oxygen saturations were monitored continuously. The                            [id number]-[id number] Colonoscope [id number] was introduced                            through the anus and advanced to the 2 cm into the                            ileum. The colonoscopy was  performed without                            difficulty. The patient tolerated the procedure                            well. The quality of the bowel preparation was                            good. The terminal ileum, ileocecal valve,                            appendiceal orifice, and rectum were photographed. Scope In: 1:24:28 PM Scope Out: 1:52:05 PM Scope Withdrawal Time: 0 hours 22 minutes 30 seconds  Total Procedure Duration: 0 hours 27 minutes 37 seconds  Findings:                 Three sessile polyps were found in the proximal                            ascending colon (2) and cecum. The polyps were 4 to                            5 mm in size. These polyps were removed with a cold                            snare. Resection and retrieval were complete.                           Rare (2-3) small-mouthed diverticula were found in                            the sigmoid colon and transverse colon.                           Non-bleeding internal hemorrhoids were found during                            retroflexion. The  hemorrhoids were small and Grade                            I (internal hemorrhoids that do not prolapse).                           The terminal ileum appeared normal.                           The exam was otherwise without abnormality on                            direct and retroflexion views. Complications:            No immediate complications. Estimated Blood Loss:     Estimated blood loss: none. Impression:               - Three 4 to 5 mm polyps in the proximal ascending                            colon and in the cecum, removed with a cold snare.                            Resected and retrieved.                           - Very minimal colonic diverticulosis.                           - Non-bleeding internal hemorrhoids.                           - The examined portion of the ileum was normal.                           - The examination was otherwise normal  on direct                            and retroflexion views. Recommendation:           - Patient has a contact number available for                            emergencies. The signs and symptoms of potential                            delayed complications were discussed with the                            patient. Return to normal activities tomorrow.                            Written discharge instructions were provided to the                            patient.                           -  Resume previous diet.                           - Continue present medications.                           - Await pathology results.                           - Repeat colonoscopy for surveillance based on                            pathology results.                           - The findings and recommendations were discussed                            with Dorene Sorrow.Lynann Bologna, MD 02/11/2023 1:56:27 PM This report has been signed electronically.

## 2023-02-11 NOTE — Progress Notes (Signed)
[facility] History and Physical   Primary Care Physician:  Lise Auer, MD   Reason for Procedure:   CRC screening  Plan:    colon     HPI: Kimberly Schwartz is a 63 y.o. female    Past Medical History:  Diagnosis Date   Asthma    Osteoporosis     Past Surgical History:  Procedure Laterality Date   CESAREAN SECTION  1993    Prior to Admission medications   Medication Sig Start Date End Date Taking? Authorizing Provider  albuterol (VENTOLIN HFA) 108 (90 Base) MCG/ACT inhaler Inhale 2 puffs into the lungs as needed for wheezing or shortness of breath.   Yes [provider]  alendronate (FOSAMAX) 70 MG tablet Take 1 tablet (70 mg total) by mouth once a week. Take with a full glass of water on an empty stomach. 05/25/22  Yes Patton Salles, MD  Calcium Carb-Cholecalciferol (CALCIUM 1000 + D PO) Take by mouth.   Yes [provider]  Fluticasone-Salmeterol 113-14 MCG/ACT AEPB Inhale 1 puff into the lungs 2 (two) times daily. 09/19/22  Yes [provider]  rosuvastatin (CRESTOR) 5 MG tablet Take 5 mg by mouth daily. 02/22/20  Yes [provider]    Current Outpatient Medications  Medication Sig Dispense Refill   albuterol (VENTOLIN HFA) 108 (90 Base) MCG/ACT inhaler Inhale 2 puffs into the lungs as needed for wheezing or shortness of breath.     alendronate (FOSAMAX) 70 MG tablet Take 1 tablet (70 mg total) by mouth once a week. Take with a full glass of water on an empty stomach. 12 tablet 3   Calcium Carb-Cholecalciferol (CALCIUM 1000 + D PO) Take by mouth.     Fluticasone-Salmeterol 113-14 MCG/ACT AEPB Inhale 1 puff into the lungs 2 (two) times daily.     rosuvastatin (CRESTOR) 5 MG tablet Take 5 mg by mouth daily.     Current Facility-Administered Medications  Medication Dose Route Frequency Provider Last Rate Last Admin   0.9 %  sodium chloride infusion  500 mL Intravenous Once Lynann Bologna, MD        Allergies  as of 02/11/2023 - Review Complete 02/11/2023  Allergen Reaction Noted   Flexeril [cyclobenzaprine] Hives 12/09/2021    Family History  Problem Relation Age of Onset   Breast cancer Mother 21       after menopause   Breast cancer Sister 63       inflammatory breast cancer; pre-menopausal   Prostate cancer Brother 15   Cancer Brother        Liver   Heart attack Maternal Uncle 40   Breast cancer Cousin 40   Hodgkin's lymphoma Cousin        dx in her teens and went on to develop breast cancer   Bladder Cancer Cousin        non-smoker   Colon cancer Neg Hx    Rectal cancer Neg Hx    Stomach cancer Neg Hx    Esophageal cancer Neg Hx     Social History   Socioeconomic History   Marital status: Married    Spouse name: Not on file   Number of children: 1   Years of education: Not on file   Highest education level: Not on file  Occupational History    Employer: [employer]  Tobacco Use   Smoking status: Never    Passive exposure: Never   Smokeless tobacco: Never  Vaping Use  Vaping status: Never Used  Substance and Sexual Activity   Alcohol use: Yes    Alcohol/week: 14.0 standard drinks of alcohol    Types: 14 Standard drinks or equivalent per week   Drug use: No   Sexual activity: Yes    Partners: Male    Birth control/protection: Post-menopausal    Comment: 1st intercourse 63 yo-Fewer than 5 partners  Other Topics Concern   Not on file  Social History Narrative   Not on file   Social Drivers of Health   Financial Resource Strain: Not on file  Food Insecurity: Not on file  Transportation Needs: Not on file  Physical Activity: Not on file  Stress: Not on file  Social Connections: Not on file  Intimate Partner Violence: Not on file    Review of Systems: Positive for none All other review of systems negative except as mentioned in the HPI.  Physical Exam: Vital signs in last 24 hours: @VSRANGES @   General:   Alert,  Well-developed, well-nourished,  pleasant and cooperative in NAD Lungs:  Clear throughout to auscultation.   Heart:  Regular rate and rhythm; no murmurs, clicks, rubs,  or gallops. Abdomen:  Soft, nontender and nondistended. Normal bowel sounds.   Neuro/Psych:  Alert and cooperative. Normal mood and affect. A and O x 3    No significant changes were identified.  The patient continues to be an appropriate candidate for the planned procedure and anesthesia.   Edman Circle, MD. [facility] 02/11/2023 1:19 PM@

## 2023-02-11 NOTE — Progress Notes (Signed)
Pt's states no medical or surgical changes since previsit or office visit. 

## 2023-02-11 NOTE — Progress Notes (Signed)
Sedate, gd SR, tolerated procedure well, VSS, report to RN 

## 2023-02-14 ENCOUNTER — Telehealth: Payer: Self-pay

## 2023-02-14 NOTE — Telephone Encounter (Signed)
  Follow up Call-     02/11/2023   12:53 PM  Call back number  Post procedure Call Back phone  # [phone]  Permission to leave phone message Yes     Patient questions:  Do you have a fever, pain , or abdominal swelling? No. Pain Score  0 *  Have you tolerated food without any problems? Yes.    Have you been able to return to your normal activities? Yes.    Do you have any questions about your discharge instructions: Diet   No. Medications  No. Follow up visit  No.  Do you have questions or concerns about your Care? No.  Actions: * If pain score is 4 or above: No action needed, pain <4.

## 2023-02-16 LAB — SURGICAL PATHOLOGY

## 2023-02-17 DIAGNOSIS — Z23 Encounter for immunization: Secondary | ICD-10-CM | POA: Diagnosis not present

## 2023-02-18 ENCOUNTER — Other Ambulatory Visit (HOSPITAL_COMMUNITY)
Admission: RE | Admit: 2023-02-18 | Discharge: 2023-02-18 | Disposition: A | Discharge status: Home / Self Care | Payer: Self-pay | Source: Ambulatory Visit | Attending: Medical Genetics | Admitting: Medical Genetics

## 2023-02-18 DIAGNOSIS — Z006 Encounter for examination for normal comparison and control in clinical research program: Secondary | ICD-10-CM | POA: Insufficient documentation

## 2023-02-20 ENCOUNTER — Encounter: Payer: Self-pay | Admitting: Gastroenterology

## 2023-02-23 ENCOUNTER — Other Ambulatory Visit: Payer: Self-pay

## 2023-02-23 NOTE — Telephone Encounter (Signed)
[facility] [address] [place] for Alendronate Sodium 70mg  #12.  RX was written 04/28/22 for #12 with 3 refills.  Pharmacy contacted by phone [phone] and left voice mail that patient should have refill on this prescription.  Pharmacy was instructed to call back if they had any questions.

## 2023-03-01 ENCOUNTER — Encounter: Payer: Self-pay | Admitting: Gastroenterology

## 2023-03-01 LAB — GENECONNECT MOLECULAR SCREEN: Genetic Analysis Overall Interpretation: NEGATIVE

## 2023-03-02 ENCOUNTER — Other Ambulatory Visit: Payer: Self-pay

## 2023-03-02 DIAGNOSIS — M816 Localized osteoporosis [Lequesne]: Secondary | ICD-10-CM

## 2023-03-02 NOTE — Telephone Encounter (Signed)
 Medication refill request: fosamax  70mg  Last AEX:  04-28-22 Next AEX: not scheduled Last MMG (if hormonal medication request): n/a Refill authorized: rx sent 05-25-22 #12 with 3 refills please approve or deny as appropriate

## 2023-03-03 MED ORDER — ALENDRONATE SODIUM 70 MG PO TABS: 70.0000 mg | ORAL_TABLET | ORAL | 0 refills | Status: DC

## 2023-04-25 ENCOUNTER — Other Ambulatory Visit: Payer: Self-pay | Admitting: Nurse Practitioner

## 2023-04-25 DIAGNOSIS — M816 Localized osteoporosis [Lequesne]: Secondary | ICD-10-CM

## 2023-04-26 NOTE — Telephone Encounter (Signed)
 Med refill request: Fosamax Last AEX: 04/28/2022-JC Next AEX: 05/03/2023-JC Last DXA: 08/18/2021-osteoporosis Refill authorized: rx pend.   Rx sent for #12 on 03/03/2023. Will refuse for now to be discussed/filled prn at upcoming appt.

## 2023-04-29 ENCOUNTER — Ambulatory Visit: Payer: BC Managed Care – PPO | Admitting: Radiology

## 2023-05-02 ENCOUNTER — Other Ambulatory Visit: Payer: Self-pay | Admitting: Nurse Practitioner

## 2023-05-02 ENCOUNTER — Other Ambulatory Visit: Payer: Self-pay

## 2023-05-02 DIAGNOSIS — M816 Localized osteoporosis [Lequesne]: Secondary | ICD-10-CM

## 2023-05-02 NOTE — Telephone Encounter (Signed)
 Med refill request: Fosamax Last AEX: 04/28/22 Next AEX: 05/03/23 Last MMG (if hormonal med) 06/30/22 Refill authorized: Please Advise?

## 2023-05-03 ENCOUNTER — Ambulatory Visit (INDEPENDENT_AMBULATORY_CARE_PROVIDER_SITE_OTHER): Payer: BC Managed Care – PPO | Admitting: Radiology

## 2023-05-03 ENCOUNTER — Encounter: Payer: Self-pay | Admitting: Radiology

## 2023-05-03 VITALS — BP 112/66 | HR 114 | Ht 64.75 in | Wt 137.6 lb

## 2023-05-03 DIAGNOSIS — M816 Localized osteoporosis [Lequesne]: Secondary | ICD-10-CM

## 2023-05-03 DIAGNOSIS — Z1331 Encounter for screening for depression: Secondary | ICD-10-CM

## 2023-05-03 DIAGNOSIS — Z01419 Encounter for gynecological examination (general) (routine) without abnormal findings: Secondary | ICD-10-CM | POA: Diagnosis not present

## 2023-05-03 MED ORDER — ALENDRONATE SODIUM 70 MG PO TABS: 70.0000 mg | ORAL_TABLET | ORAL | 0 refills | Status: DC

## 2023-05-03 NOTE — Progress Notes (Signed)
   Kimberly Schwartz 08/28/1960 [id number]   History: Postmenopausal 63 y.o. presents for annual exam. Doing well, no new gyn concerns. Had Mohs' surgery this year on her forehead. Followed by derm yearly now.    Gynecologic History Postmenopausal Last Pap: 2023. Results were: normal Last mammogram: 06/30/22. Results were: normal Last colonoscopy: 2014 DEXA: 7/23 osteoporosis, on fosamax   Obstetric History OB History  Gravida Para Term Preterm AB Living  1 1    1   SAB IAB Ectopic Multiple Live Births          # Outcome Date GA Lbr Len/2nd Weight Sex Type Anes PTL Lv  1 Para                05/03/2023    4:02 PM  Depression screen PHQ 2/9  Decreased Interest 1  Down, Depressed, Hopeless 0  PHQ - 2 Score 1    The following portions of the patient's history were reviewed and updated as appropriate: allergies, current medications, past family history, past medical history, past social history, past surgical history, and problem list.  Review of Systems Pertinent items noted in HPI and remainder of comprehensive ROS otherwise negative.  Past medical history, past surgical history, family history and social history were all reviewed and documented in the EPIC chart.  Exam:  Vitals:   05/03/23 1604  BP: 112/66  Pulse: (!) 114  SpO2: 95%  Weight: 137 lb 9.6 oz (62.4 kg)  Height: 5' 4.75" (1.645 m)   Body mass index is 23.07 kg/m.  General appearance:  Normal Thyroid:  Symmetrical, normal in size, without palpable masses or nodularity. Respiratory  Auscultation:  Clear without wheezing or rhonchi Cardiovascular  Auscultation:  Regular rate, without rubs, murmurs or gallops  Edema/varicosities:  Not grossly evident Abdominal  Soft,nontender, without masses, guarding or rebound.  Liver/spleen:  No organomegaly noted  Hernia:  None appreciated  Skin  Inspection:  Grossly normal Breasts: Examined lying and sitting.   Right: Without masses, retractions, nipple discharge  or axillary adenopathy.   Left: Without masses, retractions, nipple discharge or axillary adenopathy. Genitourinary   Inguinal/mons:  Normal without inguinal adenopathy  External genitalia:  Normal appearing vulva with no masses, tenderness, or lesions  BUS/Urethra/Skene's glands:  Normal  Vagina:  Normal appearing with normal color and discharge, no lesions. Atrophy: mild   Cervix:  Normal appearing without discharge or lesions  Uterus:  Normal in size, shape and contour.  Midline and mobile, nontender  Adnexa/parametria:     Rt: Normal in size, without masses or tenderness.   Lt: Normal in size, without masses or tenderness.  Anus and perineum: Normal    Kimberly Schwartz, CMA present for exam.  Assessment/Plan:   1. Well woman exam with routine gynecological exam Pap due 2028  2. Age-related osteoporosis without current pathological fracture Continue fosamax, calcium and vit D  DEXA due 07/2023  Discussed SBE, colonoscopy and DEXA screening as directed. Return in 1 year for annual or sooner prn.  Adryanna Friedt B WHNP-BC, 4:10 PM 05/03/2023

## 2023-05-03 NOTE — Patient Instructions (Signed)
 Preventive Care 16-64 Years Old, Female  Preventive care refers to lifestyle choices and visits with your health care provider that can promote health and wellness. Preventive care visits are also called wellness exams.  What can I expect for my preventive care visit?  Counseling  Your health care provider may ask you questions about your:  Medical history, including:  Past medical problems.  Family medical history.  Pregnancy history.  Current health, including:  Menstrual cycle.  Method of birth control.  Emotional well-being.  Home life and relationship well-being.  Sexual activity and sexual health.  Lifestyle, including:  Alcohol, nicotine or tobacco, and drug use.  Access to firearms.  Diet, exercise, and sleep habits.  Work and work Astronomer.  Sunscreen use.  Safety issues such as seatbelt and bike helmet use.  Physical exam  Your health care provider will check your:  Height and weight. These may be used to calculate your BMI (body mass index). BMI is a measurement that tells if you are at a healthy weight.  Waist circumference. This measures the distance around your waistline. This measurement also tells if you are at a healthy weight and may help predict your risk of certain diseases, such as type 2 diabetes and high blood pressure.  Heart rate and blood pressure.  Body temperature.  Skin for abnormal spots.  What immunizations do I need?    Vaccines are usually given at various ages, according to a schedule. Your health care provider will recommend vaccines for you based on your age, medical history, and lifestyle or other factors, such as travel or where you work.  What tests do I need?  Screening  Your health care provider may recommend screening tests for certain conditions. This may include:  Lipid and cholesterol levels.  Diabetes screening. This is done by checking your blood sugar (glucose) after you have not eaten for a while (fasting).  Pelvic exam and Pap test.  Hepatitis B test.  Hepatitis C  test.  HIV (human immunodeficiency virus) test.  STI (sexually transmitted infection) testing, if you are at risk.  Lung cancer screening.  Colorectal cancer screening.  Mammogram. Talk with your health care provider about when you should start having regular mammograms. This may depend on whether you have a family history of breast cancer.  BRCA-related cancer screening. This may be done if you have a family history of breast, ovarian, tubal, or peritoneal cancers.  Bone density scan. This is done to screen for osteoporosis.  Talk with your health care provider about your test results, treatment options, and if necessary, the need for more tests.  Follow these instructions at home:  Eating and drinking    Eat a diet that includes fresh fruits and vegetables, whole grains, lean protein, and low-fat dairy products.  Take vitamin and mineral supplements as recommended by your health care provider.  Do not drink alcohol if:  Your health care provider tells you not to drink.  You are pregnant, may be pregnant, or are planning to become pregnant.  If you drink alcohol:  Limit how much you have to 0-1 drink a day.  Know how much alcohol is in your drink. In the [place] one drink equals one 12 oz bottle of beer (355 mL), one 5 oz glass of wine (148 mL), or one 1 oz glass of hard liquor (44 mL).  Lifestyle  Brush your teeth every morning and night with fluoride toothpaste. Floss one time each day.  Exercise for at least  30 minutes 5 or more days each week.  Do not use any products that contain nicotine or tobacco. These products include cigarettes, chewing tobacco, and vaping devices, such as e-cigarettes. If you need help quitting, ask your health care provider.  Do not use drugs.  If you are sexually active, practice safe sex. Use a condom or other form of protection to prevent STIs.  If you do not wish to become pregnant, use a form of birth control. If you plan to become pregnant, see your health care provider for a  prepregnancy visit.  Take aspirin only as told by your health care provider. Make sure that you understand how much to take and what form to take. Work with your health care provider to find out whether it is safe and beneficial for you to take aspirin daily.  Find healthy ways to manage stress, such as:  Meditation, yoga, or listening to music.  Journaling.  Talking to a trusted person.  Spending time with friends and family.  Minimize exposure to UV radiation to reduce your risk of skin cancer.  Safety  Always wear your seat belt while driving or riding in a vehicle.  Do not drive:  If you have been drinking alcohol. Do not ride with someone who has been drinking.  When you are tired or distracted.  While texting.  If you have been using any mind-altering substances or drugs.  Wear a helmet and other protective equipment during sports activities.  If you have firearms in your house, make sure you follow all gun safety procedures.  Seek help if you have been physically or sexually abused.  What's next?  Visit your health care provider once a year for an annual wellness visit.  Ask your health care provider how often you should have your eyes and teeth checked.  Stay up to date on all vaccines.  This information is not intended to replace advice given to you by your health care provider. Make sure you discuss any questions you have with your health care provider.  Document Revised: 07/09/2020 Document Reviewed: 07/09/2020  Elsevier Patient Education  2024 [employer].

## 2023-05-08 ENCOUNTER — Ambulatory Visit (HOSPITAL_BASED_OUTPATIENT_CLINIC_OR_DEPARTMENT_OTHER)
Admission: RE | Admit: 2023-05-08 | Discharge: 2023-05-08 | Disposition: A | Discharge status: Home / Self Care | Source: Ambulatory Visit | Attending: Family Medicine | Admitting: Family Medicine

## 2023-05-08 ENCOUNTER — Encounter (HOSPITAL_BASED_OUTPATIENT_CLINIC_OR_DEPARTMENT_OTHER): Payer: Self-pay

## 2023-05-08 VITALS — BP 113/81 | HR 88 | Temp 98.5°F | Resp 20

## 2023-05-08 DIAGNOSIS — J0110 Acute frontal sinusitis, unspecified: Secondary | ICD-10-CM

## 2023-05-08 MED ORDER — AMOXICILLIN 500 MG PO CAPS: 500.0000 mg | ORAL_CAPSULE | Freq: Three times a day (TID) | ORAL | 0 refills | Status: DC

## 2023-05-08 MED ORDER — TRIAMCINOLONE ACETONIDE 40 MG/ML IJ SUSP
40.0000 mg | Freq: Once | INTRAMUSCULAR | Status: AC
  Administered 2023-05-08: 40 mg via INTRAMUSCULAR

## 2023-05-08 NOTE — ED Triage Notes (Signed)
 Nasal congestion, chest congestion, cough, x 10 days. Has asthma and has been using nebulizer machine.

## 2023-05-08 NOTE — ED Provider Notes (Signed)
 [facility]ly [facility]    CSN: [id number] Arrival date & time: 05/08/23  0857      History   Chief Complaint Chief Complaint  Patient presents with   Cough   Sinus congestion   chest congestion    HPI Kimberly Schwartz is a 63 y.o. female.   63 year old female presents today with approximately 10 days of nasal congestion, postnasal drip, cough, chest tightness.  She has asthma and has been using her nebulizer machine almost every night for at least 30 minutes just to break up mucus and help her breathe.  She has been taken allergy medication and Mucinex DM.  Denies any fevers or chills.   Cough   Past Medical History:  Diagnosis Date   Asthma    Basal cell carcinoma    forehead 09/2022   Osteoporosis     There are no active problems to display for this patient.   Past Surgical History:  Procedure Laterality Date   CESAREAN SECTION  01/26/1991   MOHS SURGERY     forehead, 09/2022    OB History     Gravida  1   Para  1   Term      Preterm      AB      Living  1      SAB      IAB      Ectopic      Multiple      Live Births               Home Medications    Prior to Admission medications   Medication Sig Start Date End Date Taking? Authorizing Provider  amoxicillin (AMOXIL) 500 MG capsule Take 1 capsule (500 mg total) by mouth 3 (three) times daily. 05/08/23  Yes Trellis Vanoverbeke A, FNP  albuterol (VENTOLIN HFA) 108 (90 Base) MCG/ACT inhaler Inhale 2 puffs into the lungs as needed for wheezing or shortness of breath.    [provider]  alendronate (FOSAMAX) 70 MG tablet Take 1 tablet (70 mg total) by mouth once a week. Take with a full glass of water on an empty stomach. 05/03/23   Chrzanowski, Jami B, NP  Calcium Carb-Cholecalciferol (CALCIUM 1000 + D PO) Take by mouth.    [provider]  Fluticasone-Salmeterol 113-14 MCG/ACT AEPB Inhale 1 puff into the lungs 2 (two) times daily. 09/19/22   [provider]   rosuvastatin (CRESTOR) 5 MG tablet Take 5 mg by mouth daily. 02/22/20   [provider]    Family History Family History  Problem Relation Age of Onset   Breast cancer Mother 49       after menopause   Breast cancer Sister 70       inflammatory breast cancer; pre-menopausal   Prostate cancer Brother 31   Cancer Brother        Liver   Heart attack Maternal Uncle 40   Breast cancer Cousin 40   Hodgkin's lymphoma Cousin        dx in her teens and went on to develop breast cancer   Bladder Cancer Cousin        non-smoker   Colon cancer Neg Hx    Rectal cancer Neg Hx    Stomach cancer Neg Hx    Esophageal cancer Neg Hx     Social History Social History   Tobacco Use   Smoking status: Never    Passive exposure: Never   Smokeless tobacco: Never  Vaping  Use   Vaping status: Never Used  Substance Use Topics   Alcohol use: Yes    Alcohol/week: 14.0 standard drinks of alcohol    Types: 14 Standard drinks or equivalent per week   Drug use: No     Allergies   Flexeril [cyclobenzaprine]   Review of Systems Review of Systems  Respiratory:  Positive for cough.      Physical Exam Triage Vital Signs ED Triage Vitals  Encounter Vitals Group     BP 05/08/23 0905 113/81     Systolic BP Percentile --      Diastolic BP Percentile --      Pulse Rate 05/08/23 0905 88     Resp 05/08/23 0905 20     Temp 05/08/23 0905 98.5 F (36.9 C)     Temp Source 05/08/23 0905 Oral     SpO2 05/08/23 0905 96 %     Weight --      Height --      Head Circumference --      Peak Flow --      Pain Score 05/08/23 0906 0     Pain Loc --      Pain Education --      Exclude from Growth Chart --    No data found.  Updated Vital Signs BP 113/81 (BP Location: Right Arm)   Pulse 88   Temp 98.5 F (36.9 C) (Oral)   Resp 20   SpO2 96%   Visual Acuity Right Eye Distance:   Left Eye Distance:   Bilateral Distance:    Right Eye Near:   Left Eye Near:    Bilateral Near:      Physical Exam Constitutional:      General: She is not in acute distress.    Appearance: Normal appearance. She is not ill-appearing, toxic-appearing or diaphoretic.  HENT:     Head: Normocephalic and atraumatic.     Right Ear: Tympanic membrane and ear canal normal.     Left Ear: Tympanic membrane and ear canal normal.     Nose: Congestion and rhinorrhea present.     Mouth/Throat:     Pharynx: Oropharynx is clear. Posterior oropharyngeal erythema present.  Eyes:     Conjunctiva/sclera: Conjunctivae normal.  Cardiovascular:     Rate and Rhythm: Normal rate and regular rhythm.     Pulses: Normal pulses.     Heart sounds: Normal heart sounds.  Pulmonary:     Effort: Pulmonary effort is normal.     Breath sounds: Normal breath sounds.  Skin:    General: Skin is warm and dry.  Neurological:     Mental Status: She is alert.  Psychiatric:        Mood and Affect: Mood normal.      UC Treatments / Results  Labs (all labs ordered are listed, but only abnormal results are displayed) Labs Reviewed - No data to display  EKG   Radiology No results found.  Procedures Procedures (including critical care time)  Medications Ordered in UC Medications  triamcinolone acetonide (KENALOG-40) injection 40 mg (40 mg Intramuscular Given 05/08/23 0925)    Initial Impression / Assessment and Plan / UC Course  I have reviewed the triage vital signs and the nursing notes.  Pertinent labs & imaging results that were available during my care of the patient were reviewed by me and considered in my medical decision making (see chart for details).     Acute sinusitis-treating with amoxicillin.  Steroid  injection given here in clinic for inflammation. Continue the Mucinex and albuterol as needed Follow-up for any continued issues Final Clinical Impressions(s) / UC Diagnoses   Final diagnoses:  Acute non-recurrent frontal sinusitis     Discharge Instructions      Given here in  clinic.  I prescribing amoxicillin to help treat sinus infection.  You can continue over-the-counter medications as needed continue your albuterol as needed.    ED Prescriptions     Medication Sig Dispense Auth. Provider   amoxicillin (AMOXIL) 500 MG capsule Take 1 capsule (500 mg total) by mouth 3 (three) times daily. 21 capsule Landa Pine, FNP      PDMP not reviewed this encounter.   Landa Pine, FNP 05/08/23 [phone]

## 2023-05-08 NOTE — Discharge Instructions (Signed)
 Given here in clinic.  I prescribing amoxicillin to help treat sinus infection.  You can continue over-the-counter medications as needed continue your albuterol as needed.

## 2023-06-01 ENCOUNTER — Other Ambulatory Visit: Payer: Self-pay | Admitting: Radiology

## 2023-06-01 DIAGNOSIS — Z1231 Encounter for screening mammogram for malignant neoplasm of breast: Secondary | ICD-10-CM

## 2023-06-09 ENCOUNTER — Other Ambulatory Visit: Payer: Self-pay | Admitting: Radiology

## 2023-06-09 DIAGNOSIS — M816 Localized osteoporosis [Lequesne]: Secondary | ICD-10-CM

## 2023-06-09 NOTE — Telephone Encounter (Signed)
 Med refill request: alendronate  70 mg Last AEX: 05/03/23 Next AEX: none scheduled Last MMG (if hormonal med) n/a Refill authorized: alendronate  70 mg Please approve or deny as appropriate.

## 2023-07-01 ENCOUNTER — Encounter

## 2023-07-01 DIAGNOSIS — Z1231 Encounter for screening mammogram for malignant neoplasm of breast: Secondary | ICD-10-CM

## 2023-07-04 ENCOUNTER — Ambulatory Visit
Admission: RE | Admit: 2023-07-04 | Discharge: 2023-07-04 | Disposition: A | Discharge status: Home / Self Care | Source: Ambulatory Visit | Attending: Radiology | Admitting: Radiology

## 2023-07-04 DIAGNOSIS — Z1231 Encounter for screening mammogram for malignant neoplasm of breast: Secondary | ICD-10-CM

## 2023-07-26 ENCOUNTER — Other Ambulatory Visit: Payer: Self-pay | Admitting: *Deleted

## 2023-07-26 DIAGNOSIS — M816 Localized osteoporosis [Lequesne]: Secondary | ICD-10-CM

## 2023-08-03 ENCOUNTER — Other Ambulatory Visit: Payer: Self-pay | Admitting: Surgery

## 2023-08-03 DIAGNOSIS — I712 Thoracic aortic aneurysm, without rupture, unspecified: Secondary | ICD-10-CM

## 2023-08-14 IMAGING — MG MM DIGITAL SCREENING BILAT W/ TOMO AND CAD
6 of 12 series · 6 of 36 positions shown · non-contrast
Comparison: Previous exam(s).

CLINICAL DATA: Screening.

EXAM:
DIGITAL SCREENING BILATERAL MAMMOGRAM WITH TOMOSYNTHESIS AND CAD
TECHNIQUE: Bilateral screening digital craniocaudal and mediolateral oblique
mammograms were obtained. Bilateral screening digital breast
tomosynthesis was performed. The images were evaluated with
computer-aided detection.

[R XCCL synth-2D]
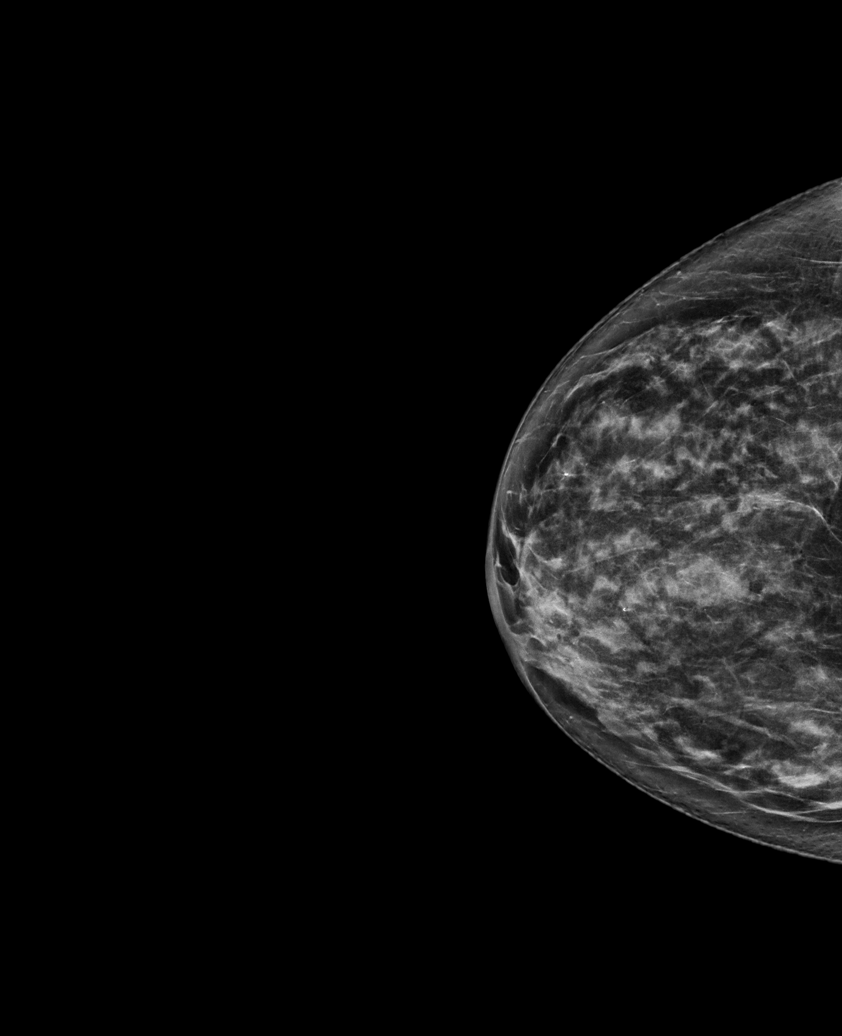

[R CC synth-2D]
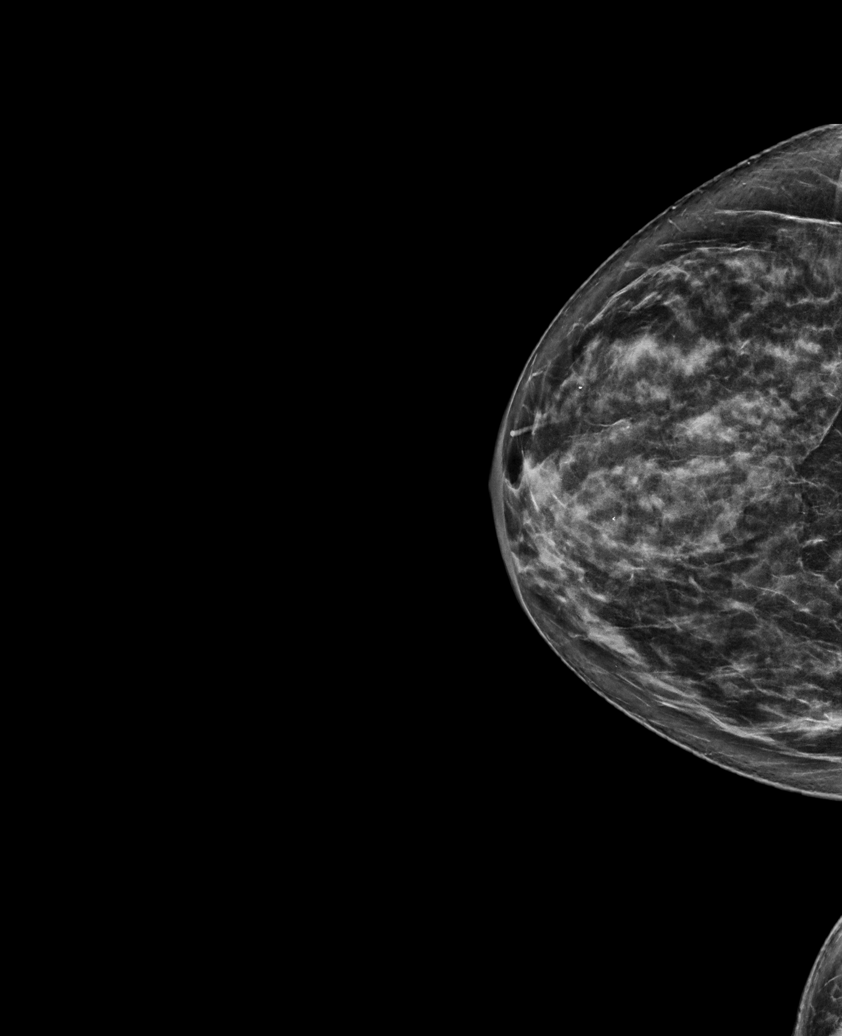

[L MLO synth-2D]
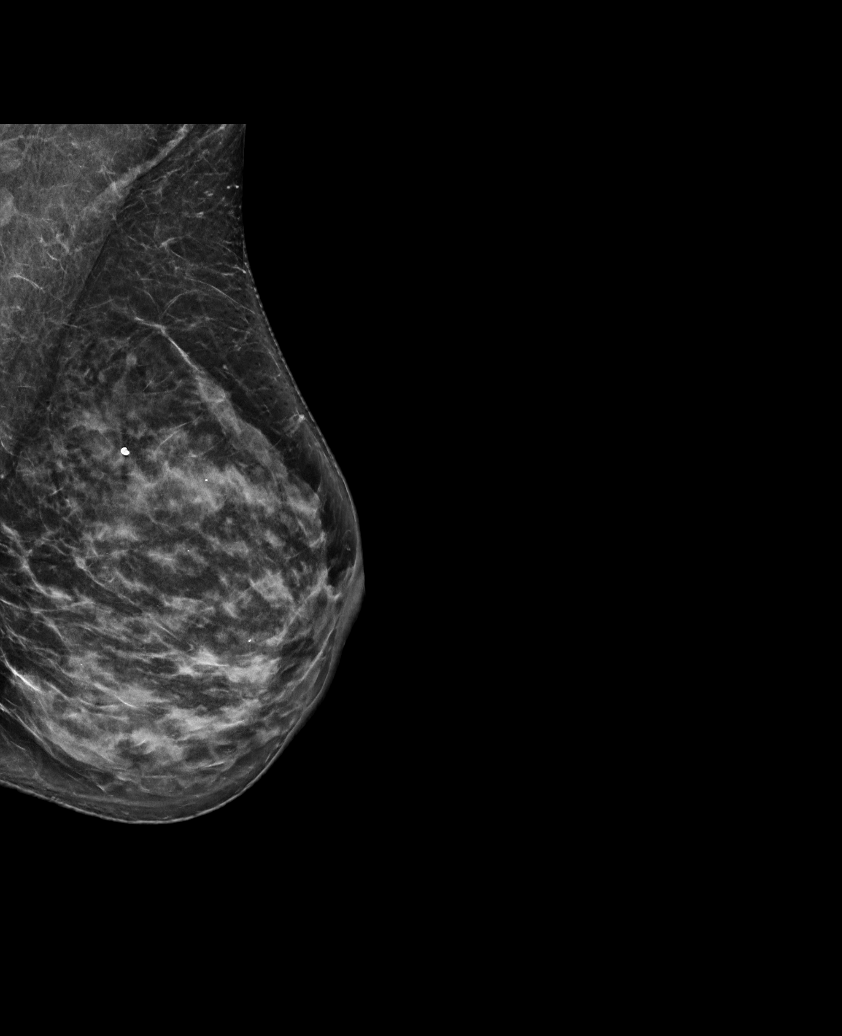

[L XCCL synth-2D]
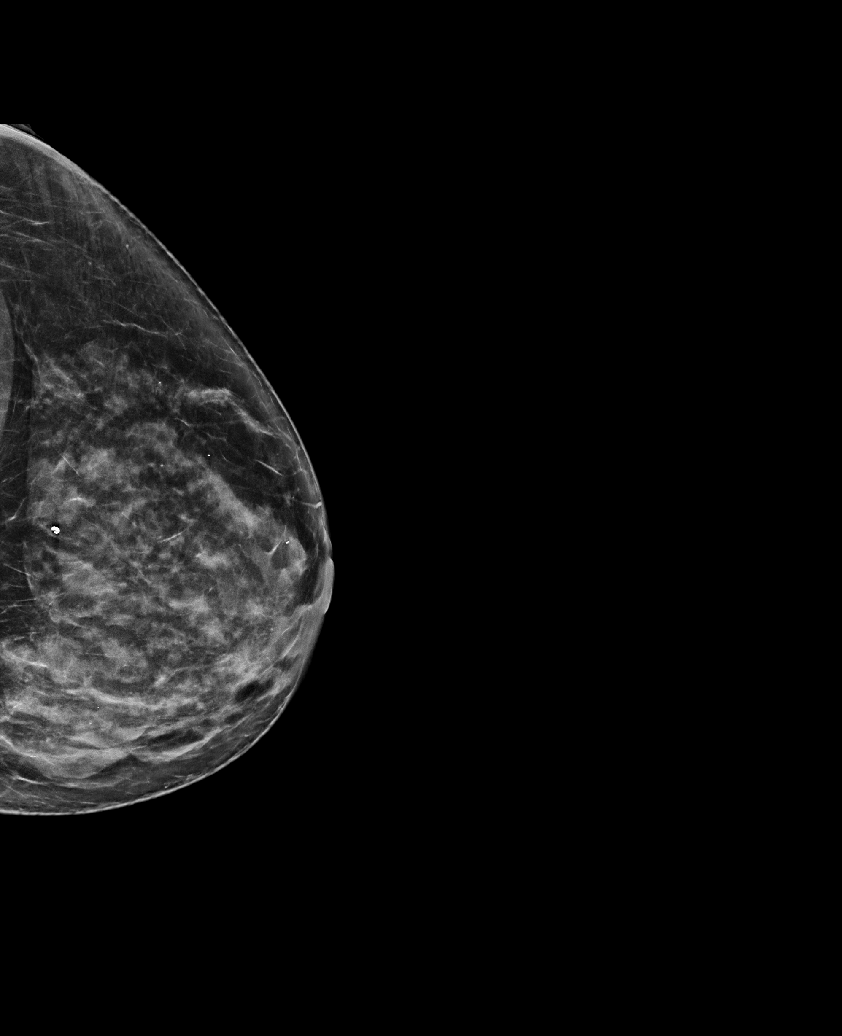

[R MLO synth-2D]
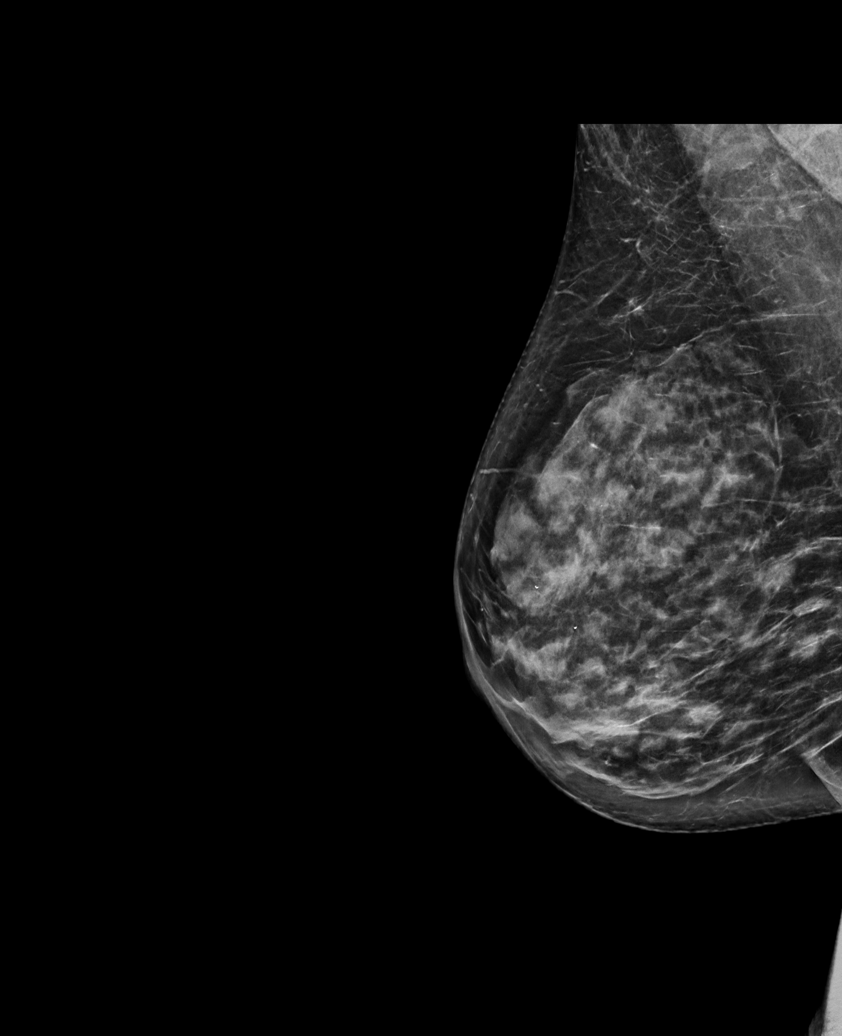

[L CC synth-2D]
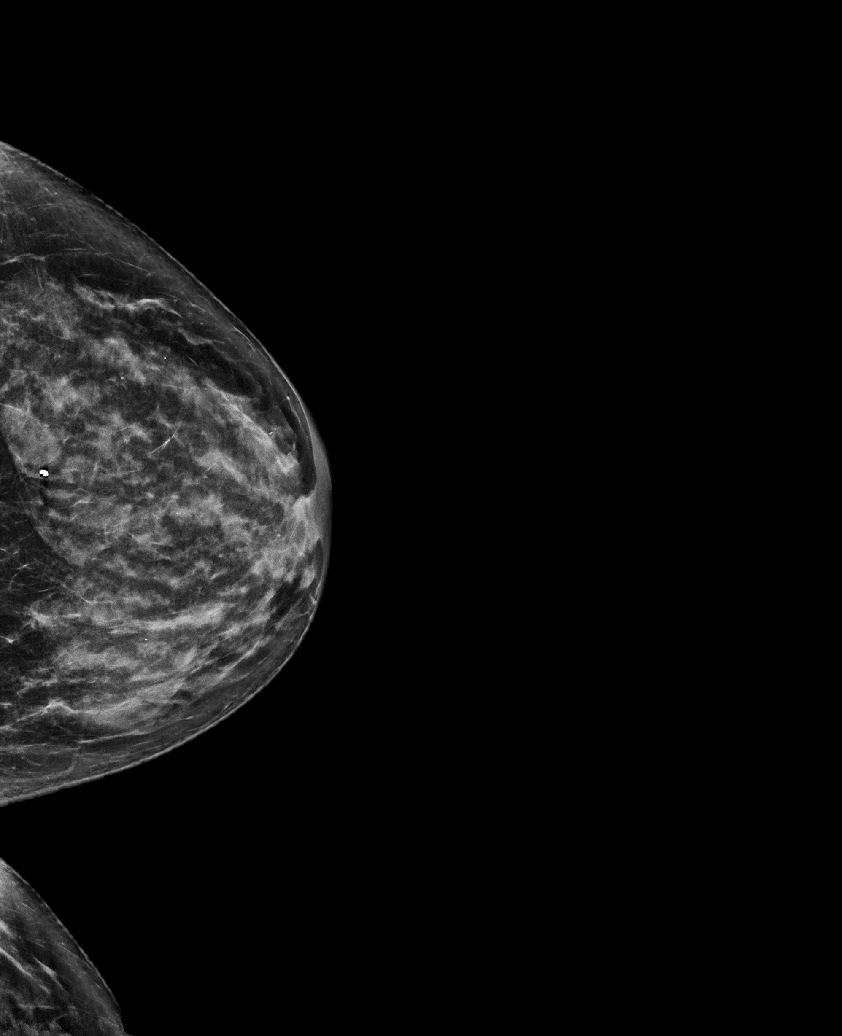

[6 of 36 positions shown; findings below may reference images not displayed]

ACR Breast Density Category d: The breast tissue is extremely dense,
which lowers the sensitivity of mammography
FINDINGS: There are no findings suspicious for malignancy.
IMPRESSION: No mammographic evidence of malignancy. A result letter of this
screening mammogram will be mailed directly to the patient.

RECOMMENDATION:
Screening mammogram in one year. (Code:[id number])

BI-RADS CATEGORY  1: Negative.

## 2023-08-23 ENCOUNTER — Ambulatory Visit (HOSPITAL_COMMUNITY)
Admission: RE | Admit: 2023-08-23 | Discharge: 2023-08-23 | Disposition: A | Discharge status: Home / Self Care | Source: Ambulatory Visit | Attending: Surgery | Admitting: Surgery

## 2023-08-23 DIAGNOSIS — I7121 Aneurysm of the ascending aorta, without rupture: Secondary | ICD-10-CM | POA: Insufficient documentation

## 2023-08-23 DIAGNOSIS — I700 Atherosclerosis of aorta: Secondary | ICD-10-CM | POA: Insufficient documentation

## 2023-08-23 DIAGNOSIS — I712 Thoracic aortic aneurysm, without rupture, unspecified: Secondary | ICD-10-CM

## 2023-08-23 MED ORDER — IOHEXOL 350 MG/ML SOLN
75.0000 mL | Freq: Once | INTRAVENOUS | Status: AC | PRN
  Administered 2023-08-23: 75 mL via INTRAVENOUS

## 2023-08-29 ENCOUNTER — Other Ambulatory Visit (HOSPITAL_BASED_OUTPATIENT_CLINIC_OR_DEPARTMENT_OTHER): Admitting: Radiology

## 2023-08-29 ENCOUNTER — Ambulatory Visit (HOSPITAL_BASED_OUTPATIENT_CLINIC_OR_DEPARTMENT_OTHER)
Admission: RE | Admit: 2023-08-29 | Discharge: 2023-08-29 | Disposition: A | Discharge status: Home / Self Care | Source: Ambulatory Visit | Attending: Radiology | Admitting: Radiology

## 2023-08-29 DIAGNOSIS — M816 Localized osteoporosis [Lequesne]: Secondary | ICD-10-CM

## 2023-08-29 DIAGNOSIS — Z780 Asymptomatic menopausal state: Secondary | ICD-10-CM | POA: Diagnosis not present

## 2023-08-29 DIAGNOSIS — M8589 Other specified disorders of bone density and structure, multiple sites: Secondary | ICD-10-CM | POA: Diagnosis not present

## 2023-08-29 NOTE — Progress Notes (Unsigned)
 [address] [place] [address]             [phone]            Kimberly Schwartz [id number] [birth year]   History of Present Illness: Kimberly Schwartz is a 63 year old female who presents for continued surveillance of a 4.0 cm ascending thoracic aortic aneurysm.  She has been followed since 2018 and aneurysm has stayed stable in size at 4 cm. Symptoms*** Blood pressure*** Exercise***     Current Outpatient Medications on File Prior to Visit  Medication Sig Dispense Refill   albuterol (VENTOLIN HFA) 108 (90 Base) MCG/ACT inhaler Inhale 2 puffs into the lungs as needed for wheezing or shortness of breath.     alendronate  (FOSAMAX ) 70 MG tablet TAKE 1 TABLET (70 MG TOTAL) BY MOUTH ONCE A WEEK. TAKE WITH A FULL GLASS OF WATER ON AN EMPTY STOMACH. 12 tablet 3   amoxicillin  (AMOXIL ) 500 MG capsule Take 1 capsule (500 mg total) by mouth 3 (three) times daily. 21 capsule 0   Calcium Carb-Cholecalciferol (CALCIUM 1000 + D PO) Take by mouth.     Fluticasone-Salmeterol 113-14 MCG/ACT AEPB Inhale 1 puff into the lungs 2 (two) times daily.     rosuvastatin (CRESTOR) 5 MG tablet Take 5 mg by mouth daily.     No current facility-administered medications on file prior to visit.     ROS:   There were no vitals taken for this visit.    Imaging: CLINICAL DATA:  Aortic aneurysm suspected.   EXAM: CT ANGIOGRAPHY CHEST WITH CONTRAST   TECHNIQUE: Multidetector CT imaging of the chest was performed using the standard protocol during bolus administration of intravenous contrast. Multiplanar CT image reconstructions and MIPs were obtained to evaluate the vascular anatomy.   RADIATION DOSE REDUCTION: This exam was performed according to the departmental dose-optimization program which includes automated exposure control, adjustment of the mA and/or kV according to patient size and/or use of iterative reconstruction technique.   CONTRAST:  75mL  OMNIPAQUE  IOHEXOL  350 MG/ML SOLN   COMPARISON:  CT dated 09/02/2021.   FINDINGS: Cardiovascular: There is no cardiomegaly or pericardial effusion. There is a 4 cm aneurysmal dilatation of the ascending aorta is similar to prior CT. No aortic dissection. Mild atherosclerotic calcification of the thoracic aorta. Evaluation of the pulmonary arteries is limited due to respiratory motion. No pulmonary artery embolus identified.   Mediastinum/Nodes: No hilar or mediastinal adenopathy. The esophagus is grossly unremarkable. No mediastinal fluid collection.   Lungs/Pleura: No focal consolidation, pleural effusion or pneumothorax. The central airways are patent.   Upper Abdomen: No acute abnormality.   Musculoskeletal: No acute osseous pathology.   Review of the MIP images confirms the above findings.   IMPRESSION: 1. No acute intrathoracic pathology. 2. Stable 4 cm aneurysmal dilatation of the ascending aorta. Recommend annual imaging followup by CTA or MRA. This recommendation follows 2010 ACCF/AHA/AATS/ACR/ASA/SCA/SCAI/SIR/STS/SVM Guidelines for the Diagnosis and Management of Patients with Thoracic Aortic Disease. Circulation. 2010; 121: Z733-z630. Aortic aneurysm NOS (ICD10-I71.9) 3.  Aortic Atherosclerosis (ICD10-I70.0).     Electronically Signed   By: Vanetta Chou M.D.   On: 08/23/2023 12:39     A/P: Thoracic aortic aneurysm without rupture, unspecified part (HCC) -4.0 cm stable ascending thoracic aortic aneurysm on CTA of chest. We discussed the natural history and and risk factors for growth of ascending aortic aneurysms. Discussed recommendations to minimize the risk of further expansion  or dissection including careful blood pressure control, avoidance of contact sports and heavy lifting, attention to lipid management.  We covered the importance of smoking ***cessation/staying never user.  The patient does not yet meet surgical criteria of >5.5cm. The patient is aware  of signs and symptoms of aortic dissection and when to present to the emergency department     Risk Modification:  Statin:  rosuvastatin 5 mg  Smoking cessation instruction/counseling given:  {CHL AMB [facility] SMOKING CESSATION COUNSELING:20758}  Patient was counseled on importance of Blood Pressure Control  They are instructed to contact their Primary Care Physician if they start to have blood pressure readings over 130s/90s. Do not ever stop blood pressure medications on your own, unless instructed by healthcare professional.  Please avoid use of Fluoroquinolones as this can potentially increase your risk of Aortic Rupture and/or Dissection  Patient educated on signs and symptoms of Aortic Dissection, handout also provided in AVS  Manuelita CHRISTELLA Rough, PA-C 08/29/23

## 2023-08-30 ENCOUNTER — Ambulatory Visit

## 2023-08-30 ENCOUNTER — Ambulatory Visit: Payer: Self-pay | Admitting: Radiology

## 2023-08-30 VITALS — BP 124/80 | HR 76 | Resp 20 | Wt 137.0 lb

## 2023-08-30 DIAGNOSIS — I712 Thoracic aortic aneurysm, without rupture, unspecified: Secondary | ICD-10-CM | POA: Diagnosis not present

## 2023-08-30 NOTE — Patient Instructions (Addendum)
 Risk Modification in those with ascending thoracic aortic aneurysm:   Continue control of blood pressure (prefer BP 130/80 or less)   2. Avoid fluoroquinolone antibiotics (I.e Ciprofloxacin, Avelox, Levofloxacin, Ofloxacin)   3.  Use of statin (to decrease cardiovascular risk)   4.  Exercise and activity limitations is individualized, but in general, contact sports are to be avoided and one should avoid heavy lifting (defined as half of ideal body weight) and exercises involving sustained Valsalva maneuver.   5.  Follow-up in two years with CT chest.  OK to use a non-contrast CT if you have had a recent study for surveillance of the lung nodule.

## 2023-09-02 DIAGNOSIS — Z23 Encounter for immunization: Secondary | ICD-10-CM | POA: Diagnosis not present

## 2023-09-02 DIAGNOSIS — M654 Radial styloid tenosynovitis [de Quervain]: Secondary | ICD-10-CM | POA: Diagnosis not present

## 2023-09-09 DIAGNOSIS — T8131XA Disruption of external operation (surgical) wound, not elsewhere classified, initial encounter: Secondary | ICD-10-CM | POA: Diagnosis not present

## 2023-09-09 DIAGNOSIS — L817 Pigmented purpuric dermatosis: Secondary | ICD-10-CM | POA: Diagnosis not present

## 2023-09-09 DIAGNOSIS — L570 Actinic keratosis: Secondary | ICD-10-CM | POA: Diagnosis not present

## 2023-09-09 DIAGNOSIS — L578 Other skin changes due to chronic exposure to nonionizing radiation: Secondary | ICD-10-CM | POA: Diagnosis not present

## 2023-09-09 DIAGNOSIS — L821 Other seborrheic keratosis: Secondary | ICD-10-CM | POA: Diagnosis not present

## 2023-12-03 ENCOUNTER — Encounter (HOSPITAL_BASED_OUTPATIENT_CLINIC_OR_DEPARTMENT_OTHER): Payer: Self-pay

## 2023-12-03 ENCOUNTER — Ambulatory Visit (HOSPITAL_BASED_OUTPATIENT_CLINIC_OR_DEPARTMENT_OTHER)
Admission: EM | Admit: 2023-12-03 | Discharge: 2023-12-03 | Disposition: A | Discharge status: Home / Self Care | Attending: Family Medicine | Admitting: Family Medicine

## 2023-12-03 DIAGNOSIS — J0101 Acute recurrent maxillary sinusitis: Secondary | ICD-10-CM

## 2023-12-03 MED ORDER — PREDNISONE 20 MG PO TABS: 40.0000 mg | ORAL_TABLET | Freq: Every day | ORAL | 0 refills | Status: AC

## 2023-12-03 MED ORDER — AMOXICILLIN 500 MG PO CAPS: 500.0000 mg | ORAL_CAPSULE | Freq: Three times a day (TID) | ORAL | 0 refills | Status: AC

## 2023-12-03 NOTE — ED Triage Notes (Signed)
 Pt states that she has some nasal congestion and sore throat. X2-3 days  Pt states that she hasn't taking any otc medications in 24 hours.

## 2023-12-03 NOTE — Discharge Instructions (Signed)
 Treating you for a sinus infection.  Take the antibiotics as prescribed  Prednisone daily with food.  OTC mucinex as needed.

## 2023-12-04 NOTE — ED Provider Notes (Signed)
 [facility]    CSN: [id number] Arrival date & time: 12/03/23  1019      History   Chief Complaint Chief Complaint  Patient presents with   Nasal Congestion    HPI Kimberly Schwartz is a 63 y.o. female.   Patient is a 63 year old female who presents today with upper respiratory symptoms.  Pt states that she has some nasal congestion and sore throat. Pt states that she hasn't taking any otc medications in 24 hours. No fever. Gets this seasonal with weather changes.       Past Medical History:  Diagnosis Date   Asthma    Basal cell carcinoma    forehead 09/2022   Osteoporosis     There are no active problems to display for this patient.   Past Surgical History:  Procedure Laterality Date   CESAREAN SECTION  01/26/1991   MOHS SURGERY     forehead, 09/2022    OB History     Gravida  1   Para  1   Term      Preterm      AB      Living  1      SAB      IAB      Ectopic      Multiple      Live Births               Home Medications    Prior to Admission medications   Medication Sig Start Date End Date Taking? Authorizing Provider  albuterol (VENTOLIN HFA) 108 (90 Base) MCG/ACT inhaler Inhale 2 puffs into the lungs as needed for wheezing or shortness of breath.   Yes [provider]  alendronate  (FOSAMAX ) 70 MG tablet TAKE 1 TABLET (70 MG TOTAL) BY MOUTH ONCE A WEEK. TAKE WITH A FULL GLASS OF WATER ON AN EMPTY STOMACH. 06/09/23  Yes Chrzanowski, Jami B, NP  amoxicillin  (AMOXIL ) 500 MG capsule Take 1 capsule (500 mg total) by mouth 3 (three) times daily. 12/03/23  Yes Smantha Boakye A, FNP  Calcium Carb-Cholecalciferol (CALCIUM 1000 + D PO) Take by mouth.   Yes [provider]  Fluticasone-Salmeterol 113-14 MCG/ACT AEPB Inhale 1 puff into the lungs 2 (two) times daily. 09/19/22  Yes [provider]  predniSONE (DELTASONE) 20 MG tablet Take 2 tablets (40 mg total) by mouth daily with breakfast for 5 days. 12/03/23  12/08/23 Yes Emi Lymon A, FNP  rosuvastatin (CRESTOR) 5 MG tablet Take 5 mg by mouth daily. 02/22/20  Yes [provider]    Family History Family History  Problem Relation Age of Onset   Breast cancer Mother 30       after menopause   Breast cancer Sister 73       inflammatory breast cancer; pre-menopausal   Prostate cancer Brother 92   Cancer Brother        Liver   Heart attack Maternal Uncle 40   Breast cancer Cousin 40   Hodgkin's lymphoma Cousin        dx in her teens and went on to develop breast cancer   Bladder Cancer Cousin        non-smoker   Colon cancer Neg Hx    Rectal cancer Neg Hx    Stomach cancer Neg Hx    Esophageal cancer Neg Hx     Social History Social History   Tobacco Use   Smoking status: Never    Passive exposure: Never  Smokeless tobacco: Never  Vaping Use   Vaping status: Never Used  Substance Use Topics   Alcohol use: Yes    Alcohol/week: 14.0 standard drinks of alcohol    Types: 14 Standard drinks or equivalent per week   Drug use: No     Allergies   Flexeril [cyclobenzaprine] and Quinolones   Review of Systems Review of Systems See HPI  Physical Exam Triage Vital Signs ED Triage Vitals  Encounter Vitals Group     BP 12/03/23 1030 116/78     Girls Systolic BP Percentile --      Girls Diastolic BP Percentile --      Boys Systolic BP Percentile --      Boys Diastolic BP Percentile --      Pulse Rate 12/03/23 1030 87     Resp 12/03/23 1030 17     Temp 12/03/23 1030 98.2 F (36.8 C)     Temp Source 12/03/23 1030 Oral     SpO2 12/03/23 1030 93 %     Weight 12/03/23 1029 135 lb (61.2 kg)     Height 12/03/23 1029 5' 4.5 (1.638 m)     Head Circumference --      Peak Flow --      Pain Score 12/03/23 1029 6     Pain Loc --      Pain Education --      Exclude from Growth Chart --    No data found.  Updated Vital Signs BP 116/78 (BP Location: Right Arm)   Pulse 87   Temp 98.2 F (36.8 C) (Oral)   Resp 17    Ht 5' 4.5 (1.638 m)   Wt 135 lb (61.2 kg)   SpO2 93%   BMI 22.81 kg/m   Visual Acuity Right Eye Distance:   Left Eye Distance:   Bilateral Distance:    Right Eye Near:   Left Eye Near:    Bilateral Near:     Physical Exam Constitutional:      General: She is not in acute distress.    Appearance: Normal appearance. She is not ill-appearing, toxic-appearing or diaphoretic.  HENT:     Head: Normocephalic and atraumatic.     Right Ear: Tympanic membrane and ear canal normal.     Left Ear: Tympanic membrane and ear canal normal.     Nose: Congestion and rhinorrhea present.     Mouth/Throat:     Pharynx: Oropharynx is clear.  Eyes:     Conjunctiva/sclera: Conjunctivae normal.  Cardiovascular:     Rate and Rhythm: Normal rate and regular rhythm.     Pulses: Normal pulses.     Heart sounds: Normal heart sounds.  Pulmonary:     Effort: Pulmonary effort is normal.     Breath sounds: Normal breath sounds.  Skin:    General: Skin is warm and dry.  Neurological:     Mental Status: She is alert.  Psychiatric:        Mood and Affect: Mood normal.      UC Treatments / Results  Labs (all labs ordered are listed, but only abnormal results are displayed) Labs Reviewed - No data to display  EKG   Radiology No results found.  Procedures Procedures (including critical care time)  Medications Ordered in UC Medications - No data to display  Initial Impression / Assessment and Plan / UC Course  I have reviewed the triage vital signs and the nursing notes.  Pertinent labs & imaging results  that were available during my care of the patient were reviewed by me and considered in my medical decision making (see chart for details).     Sinusitis-treating for sinus infection at this time.  Treating with amoxicillin  and prednisone.  Recommend over-the-counter medicines like Mucinex as needed.  Follow-up as needed Final Clinical Impressions(s) / UC Diagnoses   Final diagnoses:   Acute recurrent maxillary sinusitis     Discharge Instructions      Treating you for a sinus infection.  Take the antibiotics as prescribed  Prednisone daily with food.  OTC mucinex as needed.     ED Prescriptions     Medication Sig Dispense Auth. Provider   amoxicillin  (AMOXIL ) 500 MG capsule Take 1 capsule (500 mg total) by mouth 3 (three) times daily. 21 capsule Shamyra Farias A, FNP   predniSONE (DELTASONE) 20 MG tablet Take 2 tablets (40 mg total) by mouth daily with breakfast for 5 days. 10 tablet Adah Wilbert LABOR, FNP      PDMP not reviewed this encounter.   Adah Wilbert LABOR, FNP 12/04/23 1133

## 2023-12-21 ENCOUNTER — Other Ambulatory Visit (HOSPITAL_BASED_OUTPATIENT_CLINIC_OR_DEPARTMENT_OTHER): Payer: Self-pay | Admitting: Family Medicine

## 2023-12-21 DIAGNOSIS — Z136 Encounter for screening for cardiovascular disorders: Secondary | ICD-10-CM

## 2023-12-27 ENCOUNTER — Ambulatory Visit (HOSPITAL_BASED_OUTPATIENT_CLINIC_OR_DEPARTMENT_OTHER)
Admission: RE | Admit: 2023-12-27 | Discharge: 2023-12-27 | Disposition: A | Payer: Self-pay | Source: Ambulatory Visit | Attending: Family Medicine | Admitting: Family Medicine

## 2023-12-27 DIAGNOSIS — Z136 Encounter for screening for cardiovascular disorders: Secondary | ICD-10-CM
